# Patient Record
Sex: Male | Born: 1985
Health system: Southern US, Community
[De-identification: ages and names within clinical notes are randomized; demographics above are authoritative.]

## PROBLEM LIST (undated history)

## (undated) DIAGNOSIS — F209 Schizophrenia, unspecified: Secondary | ICD-10-CM

## (undated) DIAGNOSIS — K219 Gastro-esophageal reflux disease without esophagitis: Secondary | ICD-10-CM

## (undated) DIAGNOSIS — Z87898 Personal history of other specified conditions: Secondary | ICD-10-CM

## (undated) DIAGNOSIS — F319 Bipolar disorder, unspecified: Secondary | ICD-10-CM

## (undated) DIAGNOSIS — Z8711 Personal history of peptic ulcer disease: Secondary | ICD-10-CM

## (undated) DIAGNOSIS — N50812 Left testicular pain: Secondary | ICD-10-CM

## (undated) DIAGNOSIS — R3911 Hesitancy of micturition: Secondary | ICD-10-CM

## (undated) DIAGNOSIS — Z8719 Personal history of other diseases of the digestive system: Secondary | ICD-10-CM

## (undated) DIAGNOSIS — Z972 Presence of dental prosthetic device (complete) (partial): Secondary | ICD-10-CM

## (undated) DIAGNOSIS — G43909 Migraine, unspecified, not intractable, without status migrainosus: Secondary | ICD-10-CM

## (undated) DIAGNOSIS — R3915 Urgency of urination: Secondary | ICD-10-CM

## (undated) HISTORY — DX: Gastro-esophageal reflux disease without esophagitis: K21.9

## (undated) HISTORY — PX: APPENDECTOMY: SHX54

---

## 2003-03-14 ENCOUNTER — Inpatient Hospital Stay (HOSPITAL_COMMUNITY): Admission: EM | Admit: 2003-03-14 | Discharge: 2003-03-19 | Payer: Self-pay | Admitting: Psychiatry

## 2003-03-15 ENCOUNTER — Encounter (HOSPITAL_COMMUNITY): Payer: Self-pay | Admitting: Psychiatry

## 2003-04-25 ENCOUNTER — Inpatient Hospital Stay (HOSPITAL_COMMUNITY): Admission: EM | Admit: 2003-04-25 | Discharge: 2003-04-29 | Payer: Self-pay | Admitting: Psychiatry

## 2004-08-12 ENCOUNTER — Inpatient Hospital Stay (HOSPITAL_COMMUNITY): Admission: RE | Admit: 2004-08-12 | Discharge: 2004-08-21 | Payer: Self-pay | Admitting: Psychiatry

## 2004-08-12 ENCOUNTER — Ambulatory Visit: Payer: Self-pay | Admitting: Psychiatry

## 2004-08-14 ENCOUNTER — Encounter: Payer: Self-pay | Admitting: Psychiatry

## 2004-12-24 ENCOUNTER — Inpatient Hospital Stay (HOSPITAL_COMMUNITY): Admission: RE | Admit: 2004-12-24 | Discharge: 2004-12-28 | Payer: Self-pay | Admitting: Psychiatry

## 2004-12-24 ENCOUNTER — Ambulatory Visit: Payer: Self-pay | Admitting: Psychiatry

## 2004-12-25 ENCOUNTER — Encounter: Payer: Self-pay | Admitting: Internal Medicine

## 2005-06-03 ENCOUNTER — Ambulatory Visit: Payer: Self-pay | Admitting: Family Medicine

## 2012-02-22 DIAGNOSIS — R599 Enlarged lymph nodes, unspecified: Secondary | ICD-10-CM | POA: Diagnosis not present

## 2012-02-28 ENCOUNTER — Encounter (HOSPITAL_COMMUNITY): Payer: Self-pay | Admitting: *Deleted

## 2012-02-28 ENCOUNTER — Emergency Department (HOSPITAL_COMMUNITY)
Admission: EM | Admit: 2012-02-28 | Discharge: 2012-02-29 | Disposition: A | Discharge status: Home / Self Care | Payer: Medicare Other | Attending: Emergency Medicine | Admitting: Emergency Medicine

## 2012-02-28 ENCOUNTER — Emergency Department (HOSPITAL_COMMUNITY): Payer: Medicare Other

## 2012-02-28 DIAGNOSIS — F17200 Nicotine dependence, unspecified, uncomplicated: Secondary | ICD-10-CM | POA: Insufficient documentation

## 2012-02-28 DIAGNOSIS — I889 Nonspecific lymphadenitis, unspecified: Secondary | ICD-10-CM

## 2012-02-28 DIAGNOSIS — Z8659 Personal history of other mental and behavioral disorders: Secondary | ICD-10-CM | POA: Insufficient documentation

## 2012-02-28 DIAGNOSIS — F319 Bipolar disorder, unspecified: Secondary | ICD-10-CM | POA: Insufficient documentation

## 2012-02-28 DIAGNOSIS — R599 Enlarged lymph nodes, unspecified: Secondary | ICD-10-CM | POA: Diagnosis not present

## 2012-02-28 DIAGNOSIS — R1909 Other intra-abdominal and pelvic swelling, mass and lump: Secondary | ICD-10-CM | POA: Diagnosis not present

## 2012-02-28 HISTORY — DX: Bipolar disorder, unspecified: F31.9

## 2012-02-28 HISTORY — DX: Schizophrenia, unspecified: F20.9

## 2012-02-28 LAB — URINALYSIS, ROUTINE W REFLEX MICROSCOPIC
Glucose, UA: NEGATIVE mg/dL
Hgb urine dipstick: NEGATIVE
Leukocytes, UA: NEGATIVE
Specific Gravity, Urine: <1.005 — ABNORMAL LOW (ref 1.005–1.030)
Urobilinogen, UA: 0.2 mg/dL (ref 0.0–1.0)

## 2012-02-28 NOTE — ED Provider Notes (Signed)
History   This chart was scribed for [employer]. Colon Branch, MD by Sofie Rower. The patient was seen in room APA08/APA08 and the patient's care was started at 11:13PM.    CSN: [id number]  Arrival date & time 02/28/12  1925   None     Chief Complaint  Patient presents with  . Abdominal Pain    (Consider location/radiation/quality/duration/timing/severity/associated sxs/prior treatment) HPI  Steven Quinn is a 26 y.o. male who presents to the Emergency Department complaining of moderate, constant abdominal pain located in the left lower abdomen onset three weeks ago with associated symptoms of swelling, knots in groin area, vomiting, radiating left leg pain. Pt states "he went to the Dr. Manson Passey and was given an antibiotic (Cipro) and penicillin injection, and told it may be possible swollen lymph node. Modifying factors include sitting and exertion which intensifies the pain. Pt has a hx of appendectomy.  Pt denies fever, chills, weight loss.   PCP [facility] internal Medicine.   Past Medical History  Diagnosis Date  . Bipolar 1 disorder   . Schizophrenia     Past Surgical History  Procedure Date  . Appendectomy     No family history on file.  History  Substance Use Topics  . Smoking status: Current Everyday Smoker -- 1.0 packs/day  . Smokeless tobacco: Not on file  . Alcohol Use: No      Review of Systems  All other systems reviewed and are negative.    10 Systems reviewed and are negative for acute change except as noted in the HPI.   Allergies  Review of patient's allergies indicates no known allergies.  Home Medications   Current Outpatient Rx  Name Route Sig Dispense Refill  . CIPROFLOXACIN HCL 500 MG PO TABS Oral Take 500 mg by mouth 2 (two) times daily.      BP 128/71  Pulse 73  Temp(Src) 98.3 F (36.8 C) (Oral)  Resp 20  Ht 6\' 1"  (1.854 m)  Wt 184 lb (83.462 kg)  BMI 24.28 kg/m2  SpO2 100%  Physical Exam  Nursing note and vitals  reviewed. Constitutional: He is oriented to person, place, and time. He appears well-developed and well-nourished.  HENT:  Head: Normocephalic and atraumatic.  Right Ear: External ear normal.  Left Ear: External ear normal.  Nose: Nose normal.  Eyes: Conjunctivae and EOM are normal. No scleral icterus.  Neck: Normal range of motion. Neck supple. No thyromegaly present.  Cardiovascular: Normal rate and regular rhythm.  Exam reveals no gallop and no friction rub.   No murmur heard. Pulmonary/Chest: No stridor. He has no wheezes. He has no rales. He exhibits no tenderness.  Abdominal: He exhibits no distension. There is no tenderness. There is no rebound.  Musculoskeletal: Normal range of motion. He exhibits no edema.       1 cm fully soft tender node mid-inguinal area, one small slightly shotty node slightly above it. No other inguinal or axillary nodes detected.   Lymphadenopathy:    He has no cervical adenopathy.  Neurological: He is oriented to person, place, and time. Coordination normal.  Skin: Skin is warm and dry. No rash noted. No erythema.  Psychiatric: He has a normal mood and affect. His behavior is normal.    ED Course  Procedures (including critical care time)  DIAGNOSTIC STUDIES: Oxygen Saturation is 100% on room air, normal by my interpretation.    COORDINATION OF CARE:  Ct Abdomen Pelvis W Contrast  02/29/2012  *RADIOLOGY REPORT*  Clinical Data: Swollen inguinal lymph nodes  CT ABDOMEN AND PELVIS WITH CONTRAST  Technique:  Multidetector CT imaging of the abdomen and pelvis was performed following the standard protocol during bolus administration of intravenous contrast.  Contrast: OMNIPAQUE IOHEXOL 300 MG/ML IJ SOLN  Comparison: None.  Findings: Visualized lung bases are clear.  Liver is normal.  Spleen is normal.  Pancreas is normal.  The adrenal glands are normal.   The kidneys are normal.  Gallbladder is unremarkable by CT.  No biliary ductal dilation.  Stomach  and visualized large and small bowel are unremarkable.  Abdominal aorta is normal in caliber.  No significant lymphadenopathy.  No free fluid or abnormal fluid collections.  Prior appendectomy.  Visualized colon and small bowel are unremarkable.  No free fluid or abnormal fluid collections.  Borderline enlarged left inguinal lymph nodes are identified.  The largest lymph node measures 1.8 cm, image 86.  Urinary bladder is normal.  IMPRESSION:  1.  Enlarged left inguinal lymph nodes measure up to 1.8 cm and are of indeterminate etiology.  Likely reactive.  Differential considerations include lymphoma and metastatic adenopathy.  Original Report Authenticated By: Rosealee Albee, M.D.   Results for orders placed during the hospital encounter of 02/28/12  URINALYSIS, ROUTINE W REFLEX MICROSCOPIC      Component Value Range   Color, Urine YELLOW  YELLOW    APPearance CLEAR  CLEAR    Specific Gravity, Urine <1.005 (*) 1.005 - 1.030    pH 6.0  5.0 - 8.0    Glucose, UA NEGATIVE  NEGATIVE (mg/dL)   Hgb urine dipstick NEGATIVE  NEGATIVE    Bilirubin Urine NEGATIVE  NEGATIVE    Ketones, ur NEGATIVE  NEGATIVE (mg/dL)   Protein, ur NEGATIVE  NEGATIVE (mg/dL)   Urobilinogen, UA 0.2  0.0 - 1.0 (mg/dL)   Nitrite NEGATIVE  NEGATIVE    Leukocytes, UA NEGATIVE  NEGATIVE      11:18PM- EDP at bedside discusses treatment plan concerning lymphadenopathy and cat scan of the abdomen and pelvis.   MDM  Patient with left inguinal swollen lymph node x 3 weeks currently on antibiotics with no improvement. UA negative. CT with several large left sided lymph nodes. Given antiinflammatory and analgesic with some relief. Dx testing d/w pt and family.  Questions answered.  Verb understanding, agreeable to d/c home with outpt f/u.Pt stable in ED with no significant deterioration in condition.The patient appears reasonably screened and/or stabilized for discharge and I doubt any other medical condition or other [facility] requiring  further screening, evaluation, or treatment in the ED at this time prior to discharge. I personally performed the services described in this documentation, which was scribed in my presence. The recorded information has been reviewed and considered.  MDM Reviewed: nursing note and vitals Interpretation: labs and CT scan            Nicoletta Dress. Colon Branch, MD 02/29/12 [phone]

## 2012-02-28 NOTE — ED Notes (Signed)
Pain in left lower abdomen

## 2012-02-29 MED ORDER — IBUPROFEN 800 MG PO TABS: 800.0000 mg | ORAL_TABLET | Freq: Three times a day (TID) | ORAL | Status: AC

## 2012-02-29 MED ORDER — IOHEXOL 300 MG/ML  SOLN
100.0000 mL | Freq: Once | INTRAMUSCULAR | Status: AC | PRN
  Administered 2012-02-29: 100 mL via INTRAVENOUS

## 2012-02-29 MED ORDER — HYDROCODONE-ACETAMINOPHEN 5-325 MG PO TABS: 1.0000 | ORAL_TABLET | ORAL | Status: AC | PRN

## 2012-02-29 NOTE — Discharge Instructions (Signed)
You may apply heat or ice to the area for comfort. Use the ibuprofen as directed. Use the stronger pain medicine as needed. If it continues to be swollen after an additional 8-12 weeks, have it re-evaluated.     Swollen Lymph Nodes The lymphatic system filters fluid from around cells. It is like a system of blood vessels. These channels carry lymph instead of blood. The lymphatic system is an important part of the immune (disease fighting) system. When people talk about "swollen glands in the neck," they are usually talking about swollen lymph nodes. The lymph nodes are like the little traps for infection. You and your caregiver may be able to feel lymph nodes, especially swollen nodes, in these common areas: the groin (inguinal area), armpits (axilla), and above the clavicle (supraclavicular). You may also feel them in the neck (cervical) and the back of the head just above the hairline (occipital). Swollen glands occur when there is any condition in which the body responds with an allergic type of reaction. For instance, the glands in the neck can become swollen from insect bites or any type of minor infection on the head. These are very noticeable in children with only minor problems. Lymph nodes may also become swollen when there is a tumor or problem with the lymphatic system, such as Hodgkin's disease. TREATMENT   Most swollen glands do not require treatment. They can be observed (watched) for a short period of time, if your caregiver feels it is necessary. Most of the time, observation is not necessary.   Antibiotics (medicines that kill germs) may be prescribed by your caregiver. Your caregiver may prescribe these if he or she feels the swollen glands are due to a bacterial (germ) infection. Antibiotics are not used if the swollen glands are caused by a virus.  HOME CARE INSTRUCTIONS   Take medications as directed by your caregiver. Only take over-the-counter or prescription medicines for  pain, discomfort, or fever as directed by your caregiver.  SEEK MEDICAL CARE IF:   If you begin to run a temperature greater than 102 F (38.9 C), or as your caregiver suggests.  MAKE SURE YOU:   Understand these instructions.   Will watch your condition.   Will get help right away if you are not doing well or get worse.  Document Released: 11/19/2002 Document Revised: 11/18/2011 Document Reviewed: 11/29/2005 [facility] Patient Information 2012 [place], [place].

## 2012-03-02 DIAGNOSIS — F259 Schizoaffective disorder, unspecified: Secondary | ICD-10-CM | POA: Diagnosis not present

## 2012-04-26 DIAGNOSIS — F259 Schizoaffective disorder, unspecified: Secondary | ICD-10-CM | POA: Diagnosis not present

## 2012-06-09 DIAGNOSIS — T1500XA Foreign body in cornea, unspecified eye, initial encounter: Secondary | ICD-10-CM | POA: Diagnosis not present

## 2012-06-09 DIAGNOSIS — F17200 Nicotine dependence, unspecified, uncomplicated: Secondary | ICD-10-CM | POA: Diagnosis not present

## 2012-06-09 DIAGNOSIS — S058X9A Other injuries of unspecified eye and orbit, initial encounter: Secondary | ICD-10-CM | POA: Diagnosis not present

## 2012-06-09 DIAGNOSIS — Z79899 Other long term (current) drug therapy: Secondary | ICD-10-CM | POA: Diagnosis not present

## 2012-06-13 DIAGNOSIS — T1500XA Foreign body in cornea, unspecified eye, initial encounter: Secondary | ICD-10-CM | POA: Diagnosis not present

## 2012-06-13 DIAGNOSIS — H20039 Secondary infectious iridocyclitis, unspecified eye: Secondary | ICD-10-CM | POA: Diagnosis not present

## 2012-08-07 DIAGNOSIS — L02419 Cutaneous abscess of limb, unspecified: Secondary | ICD-10-CM | POA: Diagnosis not present

## 2012-08-07 DIAGNOSIS — F17200 Nicotine dependence, unspecified, uncomplicated: Secondary | ICD-10-CM | POA: Diagnosis not present

## 2012-08-07 DIAGNOSIS — M7989 Other specified soft tissue disorders: Secondary | ICD-10-CM | POA: Diagnosis not present

## 2012-08-07 DIAGNOSIS — L03119 Cellulitis of unspecified part of limb: Secondary | ICD-10-CM | POA: Diagnosis not present

## 2013-03-09 DIAGNOSIS — M545 Low back pain: Secondary | ICD-10-CM | POA: Diagnosis not present

## 2013-03-14 DIAGNOSIS — F259 Schizoaffective disorder, unspecified: Secondary | ICD-10-CM | POA: Diagnosis not present

## 2013-04-27 DIAGNOSIS — Z0000 Encounter for general adult medical examination without abnormal findings: Secondary | ICD-10-CM | POA: Diagnosis not present

## 2013-04-30 DIAGNOSIS — Z79899 Other long term (current) drug therapy: Secondary | ICD-10-CM | POA: Diagnosis not present

## 2013-09-08 DIAGNOSIS — F209 Schizophrenia, unspecified: Secondary | ICD-10-CM | POA: Diagnosis not present

## 2013-09-08 DIAGNOSIS — Z79899 Other long term (current) drug therapy: Secondary | ICD-10-CM | POA: Diagnosis not present

## 2013-09-08 DIAGNOSIS — R45851 Suicidal ideations: Secondary | ICD-10-CM | POA: Diagnosis not present

## 2013-09-08 DIAGNOSIS — F319 Bipolar disorder, unspecified: Secondary | ICD-10-CM | POA: Diagnosis not present

## 2013-09-08 DIAGNOSIS — F17200 Nicotine dependence, unspecified, uncomplicated: Secondary | ICD-10-CM | POA: Diagnosis not present

## 2013-09-08 DIAGNOSIS — F489 Nonpsychotic mental disorder, unspecified: Secondary | ICD-10-CM | POA: Diagnosis not present

## 2013-09-12 DIAGNOSIS — F3160 Bipolar disorder, current episode mixed, unspecified: Secondary | ICD-10-CM | POA: Diagnosis not present

## 2013-09-18 DIAGNOSIS — F259 Schizoaffective disorder, unspecified: Secondary | ICD-10-CM | POA: Diagnosis not present

## 2014-01-16 ENCOUNTER — Ambulatory Visit (INDEPENDENT_AMBULATORY_CARE_PROVIDER_SITE_OTHER): Payer: Medicare Other | Admitting: Family Medicine

## 2014-01-16 ENCOUNTER — Encounter (INDEPENDENT_AMBULATORY_CARE_PROVIDER_SITE_OTHER): Payer: Self-pay

## 2014-01-16 ENCOUNTER — Encounter: Payer: Self-pay | Admitting: Family Medicine

## 2014-01-16 VITALS — BP 111/70 | HR 67 | Temp 97.9°F | Wt 162.0 lb

## 2014-01-16 DIAGNOSIS — R599 Enlarged lymph nodes, unspecified: Secondary | ICD-10-CM | POA: Diagnosis not present

## 2014-01-16 DIAGNOSIS — R61 Generalized hyperhidrosis: Secondary | ICD-10-CM

## 2014-01-16 DIAGNOSIS — R5383 Other fatigue: Secondary | ICD-10-CM

## 2014-01-16 DIAGNOSIS — M129 Arthropathy, unspecified: Secondary | ICD-10-CM | POA: Diagnosis not present

## 2014-01-16 DIAGNOSIS — R5381 Other malaise: Secondary | ICD-10-CM

## 2014-01-16 DIAGNOSIS — Z7251 High risk heterosexual behavior: Secondary | ICD-10-CM | POA: Diagnosis not present

## 2014-01-16 DIAGNOSIS — R300 Dysuria: Secondary | ICD-10-CM | POA: Diagnosis not present

## 2014-01-16 DIAGNOSIS — M549 Dorsalgia, unspecified: Secondary | ICD-10-CM

## 2014-01-16 DIAGNOSIS — M1990 Unspecified osteoarthritis, unspecified site: Secondary | ICD-10-CM

## 2014-01-16 DIAGNOSIS — R109 Unspecified abdominal pain: Secondary | ICD-10-CM

## 2014-01-16 DIAGNOSIS — R590 Localized enlarged lymph nodes: Secondary | ICD-10-CM

## 2014-01-16 LAB — POCT UA - MICROSCOPIC ONLY
Casts, Ur, LPF, POC: NEGATIVE
Crystals, Ur, HPF, POC: NEGATIVE
Epithelial cells, urine per micros: NEGATIVE
Mucus, UA: NEGATIVE
RBC, urine, microscopic: NEGATIVE
WBC, Ur, HPF, POC: NEGATIVE
Yeast, UA: NEGATIVE

## 2014-01-16 LAB — POCT URINALYSIS DIPSTICK
Bilirubin, UA: NEGATIVE
Blood, UA: NEGATIVE
Glucose, UA: NEGATIVE
Ketones, UA: NEGATIVE
Leukocytes, UA: NEGATIVE
Nitrite, UA: NEGATIVE
Protein, UA: NEGATIVE
Spec Grav, UA: <=1.005
Urobilinogen, UA: NEGATIVE
pH, UA: 7.5

## 2014-01-16 LAB — POCT CBC
Granulocyte percent: 70.1 %G (ref 37–80)
HCT, POC: 47 % (ref 43.5–53.7)
Hemoglobin: 15.2 g/dL (ref 14.1–18.1)
Lymph, poc: 3.4 (ref 0.6–3.4)
MCH, POC: 29.3 pg (ref 27–31.2)
MCHC: 32.4 g/dL (ref 31.8–35.4)
MCV: 90.3 fL (ref 80–97)
MPV: 8.1 fL (ref 0–99.8)
POC Granulocyte: 9 — AB (ref 2–6.9)
POC LYMPH PERCENT: 26.3 %L (ref 10–50)
Platelet Count, POC: 156 10*3/uL (ref 142–424)
RBC: 5.2 M/uL (ref 4.69–6.13)
RDW, POC: 13.4 %
WBC: 12.8 10*3/uL — AB (ref 4.6–10.2)

## 2014-01-16 MED ORDER — CYCLOBENZAPRINE HCL 10 MG PO TABS: 10.0000 mg | ORAL_TABLET | Freq: Three times a day (TID) | ORAL | Status: DC | PRN

## 2014-01-16 MED ORDER — NAPROXEN 500 MG PO TABS: 500.0000 mg | ORAL_TABLET | Freq: Two times a day (BID) | ORAL | Status: DC

## 2014-01-16 NOTE — Progress Notes (Signed)
Subjective:    Patient ID: Steven Quinn, male    DOB: 01/16/1986, 28 y.o.   MRN: [id number]  HPI  This 28 y.o. male presents for evaluation of having multiple pain problems.  He has Back pain, neck pain, and some shoulder pain.  He has intermittant left inguinal pain That is not bothering him at present.  He states he gets some enlarged lymph nodes In his left inguinal LAD that he has been seen by a doctor for in the past and he states He was told he had inflamed lymph nodes and didn't get any tx for this.  Review of Systems    No chest pain, SOB, HA, dizziness, vision change, N/V, diarrhea, constipation, dysuria, urinary urgency or frequency, myalgias, arthralgias or rash.  Objective:   Physical Exam Vital signs noted  Well developed well nourished male.  HEENT - Head atraumatic Normocephalic                Eyes - PERRLA, Conjuctiva - clear Sclera- Clear EOMI                Ears - EAC's Wnl TM's Wnl Gross Hearing WNL                Nose - Nares patent                 Throat - oropharanx wnl Respiratory - Lungs CTA bilateral Cardiac - RRR S1 and S2 without murmur GI - Abdomen soft Nontender and bowel sounds active x 4 Extremities - No edema. Neuro - Grossly intact. GU - testes w/o mass, no inguinal hernia, left inguinal shotty lymph nodes nontender  Results for orders placed in visit on 01/16/14  POCT CBC      Result Value Range   WBC 12.8 (*) 4.6 - 10.2 K/uL   Lymph, poc 3.4  0.6 - 3.4   POC LYMPH PERCENT 26.3  10 - 50 %L   POC Granulocyte 9.0 (*) 2 - 6.9   Granulocyte percent 70.1  37 - 80 %G   RBC 5.2  4.69 - 6.13 M/uL   Hemoglobin 15.2  14.1 - 18.1 g/dL   HCT, POC 47.0  43.5 - 53.7 %   MCV 90.3  80 - 97 fL   MCH, POC 29.3  27 - 31.2 pg   MCHC 32.4  31.8 - 35.4 g/dL   RDW, POC 13.4     Platelet Count, POC 156.0  142 - 424 K/uL   MPV 8.1  0 - 99.8 fL  POCT UA - MICROSCOPIC ONLY      Result Value Range   WBC, Ur, HPF, POC negative     RBC, urine, microscopic  negative     Bacteria, U Microscopic occ     Mucus, UA negative     Epithelial cells, urine per micros negative     Crystals, Ur, HPF, POC negative     Casts, Ur, LPF, POC negative     Yeast, UA negative    POCT URINALYSIS DIPSTICK      Result Value Range   Color, UA gold     Clarity, UA clear     Glucose, UA negative     Bilirubin, UA negative     Ketones, UA negative     Spec Grav, UA <=1.005     Blood, UA negative     pH, UA 7.5     Protein, UA negative     Urobilinogen, UA  negative     Nitrite, UA negative     Leukocytes, UA Negative        Assessment & Plan:  Night sweats - Plan: POCT CBC, CMP14+EGFR, TSH, DG Chest 2 View  Other malaise and fatigue - Plan: DG Chest 2 View  Abdominal pain, unspecified site - Plan: HSV(herpes simplex vrs) 1+2 ab-IgG, HIV antibody, RPR, GC/Chlamydia Probe Amp, DG Abd 1 View  Inguinal lymphadenopathy - Right now not appreciated but states they swell on the left causing discomfort.  He denies any risky sex or cat scratches, recommend at this time tylenol and motrin And warm compresses and labs and will follow up.  Consider [place] if not better.  Leukocytes elevated And UA normal will tx empircally with cipro 540m po bid x 10 days.   Dysuria - Plan: POCT CBC, POCT UA - Microscopic Only, POCT urinalysis dipstick  Back pain - Plan: naproxen (NAPROSYN) 500 MG tablet, cyclobenzaprine (FLEXERIL) 10 MG tablet  Arthritis - Plan: cyclobenzaprine (FLEXERIL) 10 MG tablet, Arthritis Panel  WLysbeth PennerFNP

## 2014-01-17 ENCOUNTER — Ambulatory Visit (INDEPENDENT_AMBULATORY_CARE_PROVIDER_SITE_OTHER): Payer: Medicare Other

## 2014-01-17 DIAGNOSIS — R61 Generalized hyperhidrosis: Secondary | ICD-10-CM

## 2014-01-17 DIAGNOSIS — R5381 Other malaise: Secondary | ICD-10-CM | POA: Diagnosis not present

## 2014-01-17 DIAGNOSIS — R109 Unspecified abdominal pain: Secondary | ICD-10-CM

## 2014-01-17 DIAGNOSIS — R5383 Other fatigue: Secondary | ICD-10-CM

## 2014-01-17 LAB — CMP14+EGFR
ALT: 12 IU/L (ref 0–44)
AST: 16 IU/L (ref 0–40)
Albumin/Globulin Ratio: 1.9 (ref 1.1–2.5)
Albumin: 4.8 g/dL (ref 3.5–5.5)
Alkaline Phosphatase: 84 IU/L (ref 39–117)
BUN/Creatinine Ratio: 8 (ref 8–19)
BUN: 6 mg/dL (ref 6–20)
CO2: 25 mmol/L (ref 18–29)
Calcium: 9.8 mg/dL (ref 8.7–10.2)
Chloride: 100 mmol/L (ref 97–108)
Creatinine, Ser: 0.8 mg/dL (ref 0.76–1.27)
GFR calc Af Amer: 140 mL/min/{1.73_m2} (ref >59)
GFR calc non Af Amer: 121 mL/min/{1.73_m2} (ref >59)
Globulin, Total: 2.5 g/dL (ref 1.5–4.5)
Glucose: 103 mg/dL — ABNORMAL HIGH (ref 65–99)
Potassium: 5 mmol/L (ref 3.5–5.2)
Sodium: 142 mmol/L (ref 134–144)
Total Bilirubin: <0.2 mg/dL (ref 0.0–1.2)
Total Protein: 7.3 g/dL (ref 6.0–8.5)

## 2014-01-17 LAB — HSV(HERPES SIMPLEX VRS) I + II AB-IGG
HAV 1 IGG,TYPE SPECIFIC AB: 43.7 index — ABNORMAL HIGH (ref 0.00–0.90)
HSV 2 IGG,TYPE SPECIFIC AB: <0.91 index (ref 0.00–0.90)

## 2014-01-17 LAB — RPR: RPR: NONREACTIVE

## 2014-01-17 LAB — HIV ANTIBODY (ROUTINE TESTING W REFLEX)
HIV 1/O/2 Abs-Index Value: <1 (ref <1.00)
HIV-1/HIV-2 Ab: NONREACTIVE

## 2014-01-17 LAB — ARTHRITIS PANEL
Anti Nuclear Antibody(ANA): NEGATIVE
Rhuematoid fact SerPl-aCnc: 7.3 IU/mL (ref 0.0–13.9)
Sed Rate: 2 mm/hr (ref 0–15)
Uric Acid: 5.7 mg/dL (ref 3.7–8.6)

## 2014-01-17 LAB — TSH: TSH: 3.97 u[IU]/mL (ref 0.450–4.500)

## 2014-01-17 MED ORDER — CIPROFLOXACIN HCL 500 MG PO TABS: 500.0000 mg | ORAL_TABLET | Freq: Two times a day (BID) | ORAL | Status: AC

## 2014-01-19 LAB — GC/CHLAMYDIA PROBE AMP
Chlamydia trachomatis, NAA: NEGATIVE
Neisseria gonorrhoeae by PCR: NEGATIVE

## 2014-01-21 ENCOUNTER — Encounter: Payer: Self-pay | Admitting: Family Medicine

## 2014-01-21 ENCOUNTER — Ambulatory Visit (INDEPENDENT_AMBULATORY_CARE_PROVIDER_SITE_OTHER): Payer: Medicare Other | Admitting: Family Medicine

## 2014-01-21 VITALS — BP 118/69 | HR 62 | Temp 97.3°F | Ht 72.5 in | Wt 163.8 lb

## 2014-01-21 DIAGNOSIS — M1990 Unspecified osteoarthritis, unspecified site: Secondary | ICD-10-CM

## 2014-01-21 DIAGNOSIS — M129 Arthropathy, unspecified: Secondary | ICD-10-CM

## 2014-01-21 NOTE — Progress Notes (Signed)
   Subjective:    Patient ID: Steven Quinn, male    DOB: Feb 25, 1986, 28 y.o.   MRN: [id number]  HPI This 28 y.o. male presents for evaluation of follow up on arthralgias.  His labs show positive HSV 1 and otherwise arthritis panel is negative.  He has been taking naprosyn and it is helping Some for his polyarthralgias..   Review of Systems No chest pain, SOB, HA, dizziness, vision change, N/V, diarrhea, constipation, dysuria, urinary urgency or frequency, myalgias, arthralgias or rash.     Objective:   Physical Exam  Vital signs noted  Well developed well nourished male.  HEENT - Head atraumatic Normocephalic                Eyes - PERRLA, Conjuctiva - clear Sclera- Clear EOMI                Ears - EAC's Wnl TM's Wnl Gross Hearing WNL                Nose - Nares patent                 Throat - oropharanx wnl Respiratory - Lungs CTA bilateral Cardiac - RRR S1 and S2 without murmur GI - Abdomen soft Nontender and bowel sounds active x 4 Extremities - No edema. Neuro - Grossly intact.      Assessment & Plan:  Arthritis Continue naprosyn and flexeril  Deatra CanterWilliam J Crystalynn Mcinerney FNP

## 2014-03-26 DIAGNOSIS — F411 Generalized anxiety disorder: Secondary | ICD-10-CM | POA: Diagnosis not present

## 2014-07-06 DIAGNOSIS — F17200 Nicotine dependence, unspecified, uncomplicated: Secondary | ICD-10-CM | POA: Diagnosis not present

## 2014-07-06 DIAGNOSIS — N411 Chronic prostatitis: Secondary | ICD-10-CM | POA: Diagnosis not present

## 2014-07-06 DIAGNOSIS — Z8659 Personal history of other mental and behavioral disorders: Secondary | ICD-10-CM | POA: Diagnosis not present

## 2014-07-06 DIAGNOSIS — K921 Melena: Secondary | ICD-10-CM | POA: Diagnosis not present

## 2014-07-06 DIAGNOSIS — Z8711 Personal history of peptic ulcer disease: Secondary | ICD-10-CM | POA: Diagnosis not present

## 2014-07-06 DIAGNOSIS — K922 Gastrointestinal hemorrhage, unspecified: Secondary | ICD-10-CM | POA: Diagnosis not present

## 2014-07-06 DIAGNOSIS — K625 Hemorrhage of anus and rectum: Secondary | ICD-10-CM | POA: Diagnosis not present

## 2014-07-06 DIAGNOSIS — I441 Atrioventricular block, second degree: Secondary | ICD-10-CM | POA: Diagnosis not present

## 2014-07-06 DIAGNOSIS — F319 Bipolar disorder, unspecified: Secondary | ICD-10-CM | POA: Diagnosis not present

## 2014-07-06 DIAGNOSIS — Z886 Allergy status to analgesic agent status: Secondary | ICD-10-CM | POA: Diagnosis not present

## 2014-07-07 DIAGNOSIS — K921 Melena: Secondary | ICD-10-CM | POA: Diagnosis not present

## 2015-07-21 IMAGING — CR DG ABDOMEN 1V
1 series · 1 of 1 positions shown · non-contrast
Comparison: CT, [DATE]

CLINICAL DATA: Abdominal pain

EXAM:
ABDOMEN - 1 VIEW

[view not recorded]
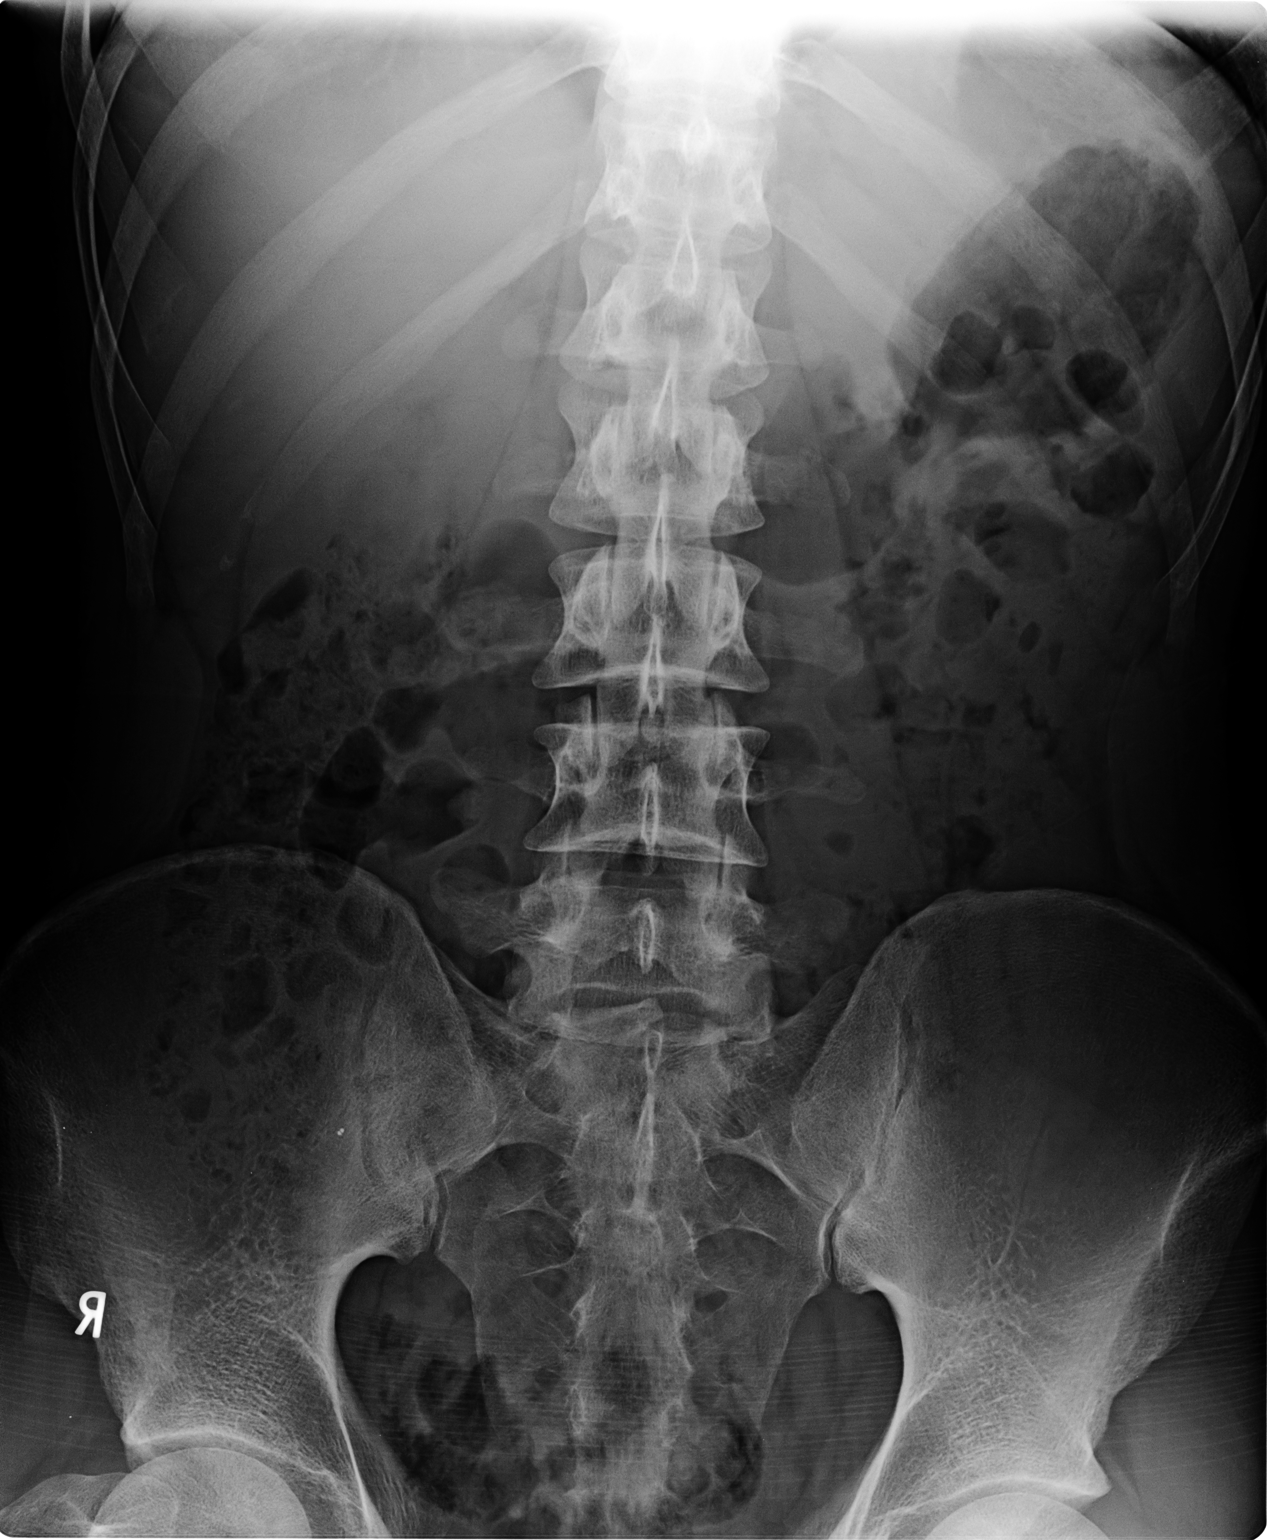

[1 of 1 positions shown; findings below may reference images not displayed]

FINDINGS: The bowel gas pattern is normal. No radio-opaque calculi or other
significant radiographic abnormality are seen.
IMPRESSION: Negative.

## 2015-07-21 IMAGING — CR DG CHEST 2V
2 series · 2 of 2 positions shown · non-contrast
Comparison: None.

CLINICAL DATA: Fatigue and night sweats

EXAM:
CHEST  2 VIEW

[view not recorded (1 of 2)]
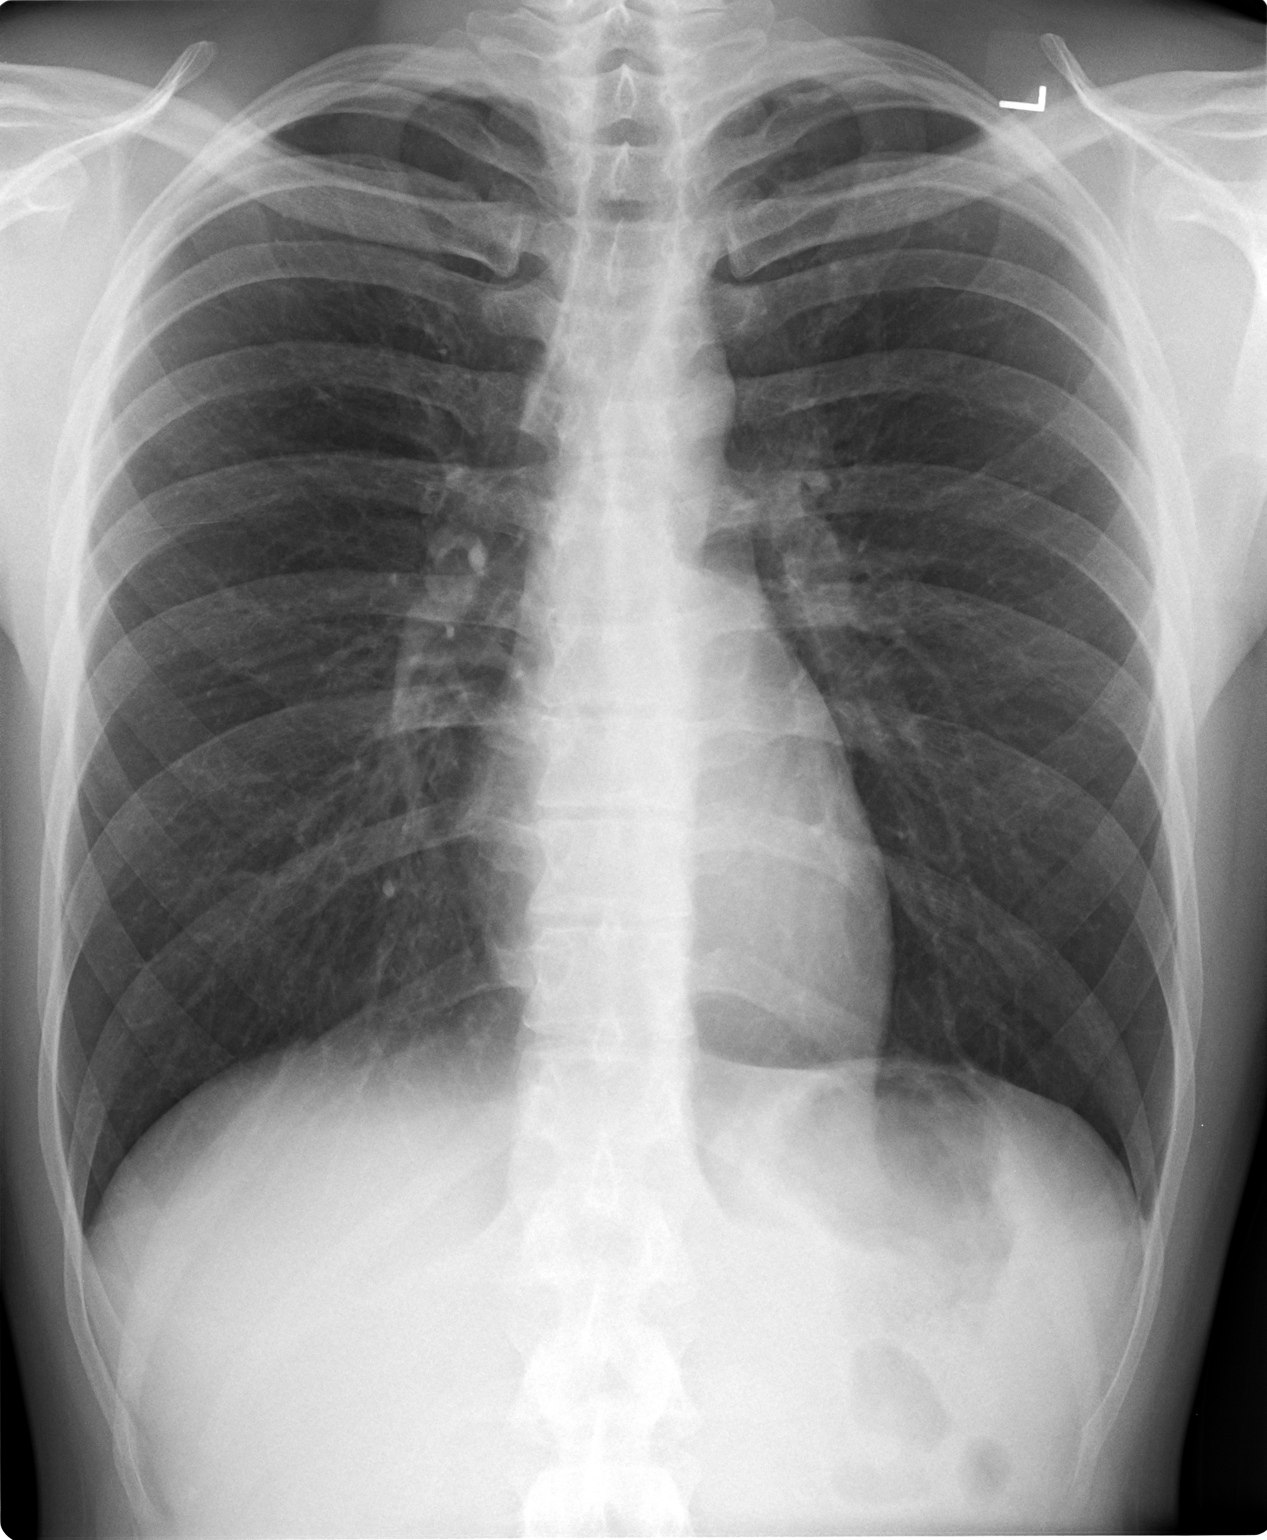

[view not recorded (2 of 2)]
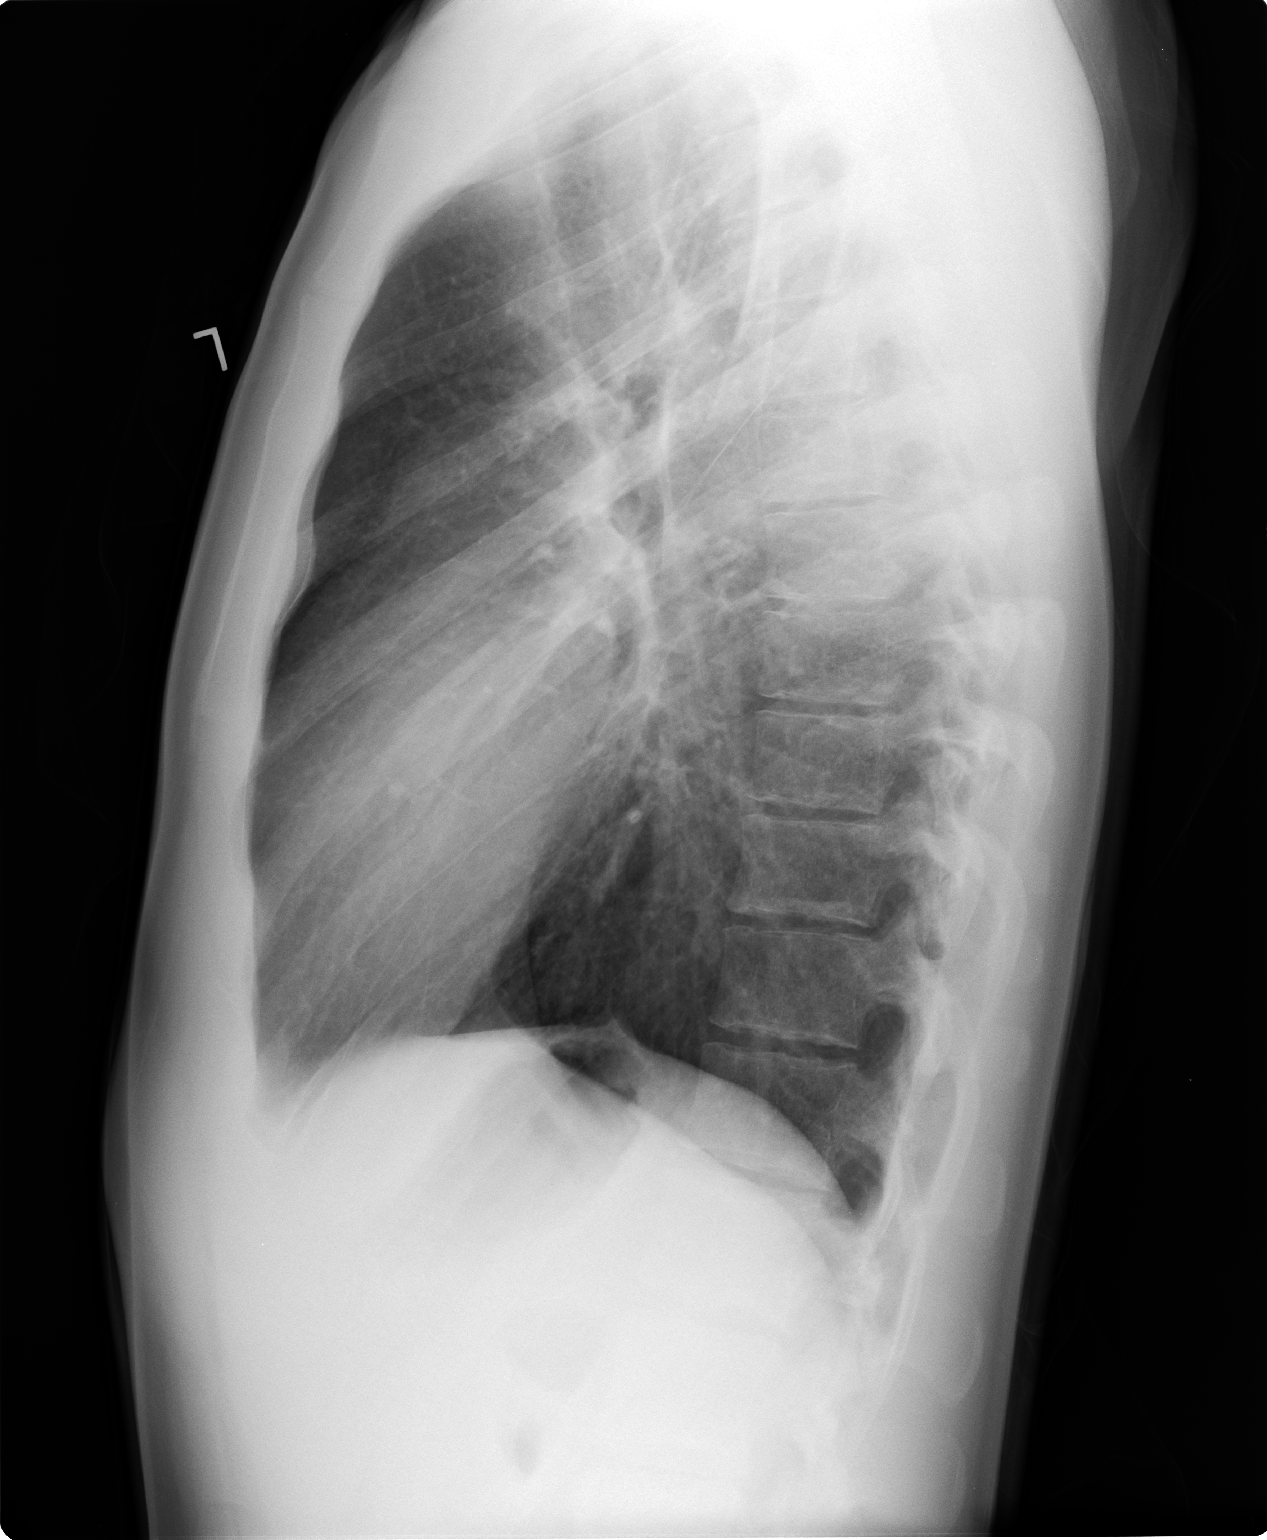

[2 of 2 positions shown; findings below may reference images not displayed]

FINDINGS: Lungs are clear. Heart size and pulmonary vascularity are normal. No
adenopathy. No bone lesions.
IMPRESSION: No abnormality noted.

## 2015-12-19 ENCOUNTER — Ambulatory Visit (INDEPENDENT_AMBULATORY_CARE_PROVIDER_SITE_OTHER): Payer: Medicare Other | Admitting: Family Medicine

## 2015-12-19 ENCOUNTER — Encounter: Payer: Self-pay | Admitting: Family Medicine

## 2015-12-19 VITALS — BP 121/78 | HR 73 | Temp 97.5°F | Ht 72.5 in | Wt 165.4 lb

## 2015-12-19 DIAGNOSIS — F39 Unspecified mood [affective] disorder: Secondary | ICD-10-CM | POA: Diagnosis not present

## 2015-12-19 DIAGNOSIS — M26629 Arthralgia of temporomandibular joint, unspecified side: Secondary | ICD-10-CM | POA: Insufficient documentation

## 2015-12-19 DIAGNOSIS — R599 Enlarged lymph nodes, unspecified: Secondary | ICD-10-CM

## 2015-12-19 DIAGNOSIS — R590 Localized enlarged lymph nodes: Secondary | ICD-10-CM | POA: Insufficient documentation

## 2015-12-19 DIAGNOSIS — M26622 Arthralgia of left temporomandibular joint: Secondary | ICD-10-CM

## 2015-12-19 LAB — POCT URINALYSIS DIPSTICK
BILIRUBIN UA: NEGATIVE
Blood, UA: NEGATIVE
GLUCOSE UA: NEGATIVE
KETONES UA: NEGATIVE
LEUKOCYTES UA: NEGATIVE
NITRITE UA: NEGATIVE
PH UA: 7
Protein, UA: NEGATIVE
Spec Grav, UA: 1.01
Urobilinogen, UA: NEGATIVE

## 2015-12-19 LAB — POCT UA - MICROSCOPIC ONLY
BACTERIA, U MICROSCOPIC: NEGATIVE
Casts, Ur, LPF, POC: NEGATIVE
Crystals, Ur, HPF, POC: NEGATIVE
MUCUS UA: NEGATIVE
RBC, URINE, MICROSCOPIC: NEGATIVE
WBC, UR, HPF, POC: NEGATIVE
Yeast, UA: NEGATIVE

## 2015-12-19 NOTE — Patient Instructions (Signed)
Great to meet you!  I think going to [facility] is a good idea.  Your lymph nodes are caused by your viral infection

## 2015-12-19 NOTE — Progress Notes (Signed)
   HPI  Patient presents today today to discuss inguinal lymphadenopathy, left-sided jaw pain, and mood disorder.  He states that he's had a left-sided inguinal lymph node which is very tender for the last year or so. He's previously for GC C, HIV, and other usual sexually transmitted infections which were negative. 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 Denies any rash or penile discharge. He states that it intermittently tender, at times he has quite a bit of pain associated with it.  Left-sided jaw pain for several weeks, he has an appointment with his dentist next week to discuss it. Described as sharp pain with opening his mouth and chewing just anterior to his left ear.  He's been diagnosed with bipolar disorder and schizophrenia, he states that the best treatment he's gotten was Klonopin. [facility]. He states [place] He denies suicidal ideation  PMH: Smoking status noted ROS: Per HPI  Objective: BP 121/78 mmHg  Pulse 73  Temp(Src) 97.5 F (36.4 C) (Oral)  Ht 6' 0.5" (1.842 m)  Wt 165 lb 6.4 oz (75.025 kg)  BMI 22.11 kg/m2 Gen: NAD, alert, cooperative with exam HEENT: NCAT, tenderness to palpation of his left TMJ with opening his mouth CV: RRR, good S1/S2, no murmur Resp: CTABL, no wheezes, non-labored  GU: No inguinal hernia, he does have bilateral enlarged lymph nodes, his right side is actually little bit more tender than his left, they are firm and mobile Ext: No edema, warm Neuro: Alert and oriented, No gross deficits  Assessment and plan:  # Inguinal lymphadenopathy Evaluate for sexually transmitted infections HSV-1 is unlikely to be the cause as he does not have genital outbreaks but rather herpes labialis If all is negative consider sending to surgery for biopsy for persistent  lymphadenopathy  # Temporomandibular joint pain. Follow-up with dentist next week Conservative care until then including NSAIDs, Tylenol  # Mood disorder No suicidal ideation He's requesting benzodiazepines I recommended that he see a psychiatrist, [facility] [place]   Orders Placed This Encounter  Procedures  . GC/Chlamydia Probe Amp  . RPR  . HIV antibody  . POCT UA - Microscopic Only  . POCT urinalysis dipstick     Murtis SinkSam Bradshaw, MD [facility] 12/19/2015, 11:32 AM

## 2015-12-20 LAB — RPR: RPR Ser Ql: NONREACTIVE

## 2015-12-20 LAB — HIV ANTIBODY (ROUTINE TESTING W REFLEX): HIV Screen 4th Generation wRfx: NONREACTIVE

## 2015-12-22 ENCOUNTER — Other Ambulatory Visit: Payer: Self-pay | Admitting: Family Medicine

## 2015-12-22 DIAGNOSIS — R590 Localized enlarged lymph nodes: Secondary | ICD-10-CM

## 2015-12-22 LAB — GC/CHLAMYDIA PROBE AMP
Chlamydia trachomatis, NAA: NEGATIVE
NEISSERIA GONORRHOEAE BY PCR: NEGATIVE

## 2016-01-23 ENCOUNTER — Ambulatory Visit: Payer: Self-pay | Admitting: Surgery

## 2016-01-23 DIAGNOSIS — R590 Localized enlarged lymph nodes: Secondary | ICD-10-CM | POA: Diagnosis not present

## 2016-01-23 NOTE — H&P (Signed)
History of Present Illness Steven Quinn. Steven Marrin MD; 01/23/2016 11:19 AM) The patient is a 30 year old male who presents with lymphadenopathy. Referred by Dr. Murtis Quinn for lymphadenopathy  This is a 30 year old male who presents with several years of palpable lymph nodes in both groins. However over the last year the left groin has become more tender. The pain radiates towards his testicle. According to Dr. Felipa Quinn note, he has tested negative for HIV and other STDs. However he is positive for HSV 1. He also has described some left-sided jaw pain. He is able to palpate lymph nodes in both groins. He also is able to palpate lymph nodes on the posterior of both sides of his neck. With further questioning, the patient reports increasing levels of fatigue with minimal activity. He and his wife also report frequent night sweats where he wakes up soaked in sweat requiring changing of the bed sheets. This has been going on for over a year. He has had minimal weight loss. He continues to smoke 1.5-2 packs per day. No recent imaging.   Other Problems Steven Quinn; 01/23/2016 10:37 AM) Anxiety Disorder Arthritis Back Pain Chest pain Depression Gastric Ulcer Gastroesophageal Reflux Disease Migraine Headache  Past Surgical History Steven Quinn; 01/23/2016 10:37 AM) Appendectomy Oral Surgery  Diagnostic Studies History Steven Quinn; 01/23/2016 10:37 AM) Colonoscopy never  Allergies Steven Quinn; 01/23/2016 10:37 AM) Darvon-N *ANALGESICS - OPIOID*  Medication History Steven Quinn; 01/23/2016 10:37 AM) No Current Medications Medications Reconciled  Social History Steven Quinn; 01/23/2016 10:37 AM) Alcohol use Occasional alcohol use. Caffeine use Carbonated beverages, Coffee, Tea. Illicit drug use Prefer to discuss with provider. Tobacco use Current every day smoker.  Family History Steven Quinn; 01/23/2016 10:37 AM) Alcohol Abuse  Family Members In General. Depression Family Members In General. Diabetes Mellitus Family Members In General, Father, Mother. Hypertension Family Members In General, Father, Mother. Migraine Headache Father.     Review of Systems Steven Quinn Quinn; 01/23/2016 10:37 AM) General Present- Appetite Loss, Fatigue and Night Sweats. Not Present- Chills, Fever, Weight Gain and Weight Loss. Skin Present- Non-Healing Wounds. Not Present- Change in Wart/Mole, Dryness, Hives, Jaundice, New Lesions, Rash and Ulcer. HEENT Present- Visual Disturbances. Not Present- Earache, Hearing Loss, Hoarseness, Nose Bleed, Oral Ulcers, Ringing in the Ears, Seasonal Allergies, Sinus Pain, Sore Throat, Wears glasses/contact lenses and Yellow Eyes. Respiratory Present- Difficulty Breathing and Wheezing. Not Present- Bloody sputum, Chronic Cough and Snoring. Cardiovascular Present- Chest Pain, Difficulty Breathing Lying Down, Leg Cramps and Shortness of Breath. Not Present- Palpitations, Rapid Heart Rate and Swelling of Extremities. Gastrointestinal Present- Abdominal Pain, Bloating, Chronic diarrhea, Gets full quickly at meals and Nausea. Not Present- Bloody Stool, Change in Bowel Habits, Constipation, Difficulty Swallowing, Excessive gas, Hemorrhoids, Indigestion, Rectal Pain and Vomiting. Male Genitourinary Present- Impotence, Nocturia and Painful Urination. Not Present- Blood in Urine, Change in Urinary Stream, Frequency, Urgency and Urine Leakage. Musculoskeletal Present- Back Pain, Joint Pain, Joint Stiffness, Muscle Pain and Muscle Weakness. Not Present- Swelling of Extremities. Neurological Present- Decreased Memory, Fainting and Headaches. Not Present- Numbness, Seizures, Tingling, Tremor, Trouble walking and Weakness. Psychiatric Present- Anxiety, Bipolar, Change in Sleep Pattern and Depression. Not Present- Fearful and Frequent crying. Endocrine Present- Hair Changes. Not Present- Cold Intolerance, Excessive  Hunger, Heat Intolerance, Hot flashes and New Diabetes. Hematology Present- Gland problems. Not Present- Easy Bruising, Excessive bleeding, HIV and Persistent Infections.  Vitals Steven Quinn Quinn; 01/23/2016 10:38 AM) 01/23/2016 10:37 AM Weight: 169 lb Height:  73in Body Surface Area: 2 m Body Mass Index: 22.3 kg/m  Temp.: 97.56F  Pulse: 87 (Regular)  BP: 130/70 (Sitting, Left Arm, Standard)      Physical Exam Steven Hazard K. Tyr Franca MD; 01/23/2016 11:20 AM)  The physical exam findings are as follows: Note:WDWN in NAD HEENT: EOMI, sclera anicteric Neck: Tender over left TMJ; no swelling; bilateral posterior cervical lymphadenopathy Lungs: CTA bilaterally; normal respiratory effort; No palpable axillary lymphadenopathy CV: Regular rate and rhythm; no murmurs Abd: +bowel sounds, soft, non-tender, no masses GU: bilateral inguinal lymphadenopathy; largest is about 2 cm; tender on left No sign of inguinal hernia Ext: Well-perfused; no edema Skin: Warm, dry; no sign of jaundice    Assessment & Plan Steven Hazard K. Serra Younan MD; 01/23/2016 11:20 AM)  LYMPHADENOPATHY, INGUINAL (R59.0) Impression: Generalized lymphadenopathy with worsening fatigue and night sweats concerning for lymphoma.  Current Plans Schedule for Surgery - left inguinal lymph node biopsy. The surgical procedure has been discussed with the patient. Potential risks, benefits, alternative treatments, and expected outcomes have been explained. All of the patient's questions at this time have been answered. The likelihood of reaching the patient's treatment goal is good. The patient understand the proposed surgical procedure and wishes to proceed. Started Percocet 5-325MG , 1 (one) Tablet every four hours, as needed, #40, 01/23/2016, No Refill.   Steven Quinn. Corliss Skains, MD, [facility]  General/ Trauma Surgery  01/23/2016 11:22 AM

## 2016-01-29 ENCOUNTER — Encounter (HOSPITAL_BASED_OUTPATIENT_CLINIC_OR_DEPARTMENT_OTHER): Payer: Self-pay | Admitting: *Deleted

## 2016-01-31 DIAGNOSIS — L253 Unspecified contact dermatitis due to other chemical products: Secondary | ICD-10-CM | POA: Diagnosis not present

## 2016-01-31 DIAGNOSIS — F17200 Nicotine dependence, unspecified, uncomplicated: Secondary | ICD-10-CM | POA: Diagnosis not present

## 2016-01-31 DIAGNOSIS — T6591XA Toxic effect of unspecified substance, accidental (unintentional), initial encounter: Secondary | ICD-10-CM | POA: Diagnosis not present

## 2016-01-31 DIAGNOSIS — L259 Unspecified contact dermatitis, unspecified cause: Secondary | ICD-10-CM | POA: Diagnosis not present

## 2016-02-02 ENCOUNTER — Other Ambulatory Visit: Payer: Self-pay | Admitting: General Surgery

## 2016-02-04 ENCOUNTER — Ambulatory Visit (HOSPITAL_BASED_OUTPATIENT_CLINIC_OR_DEPARTMENT_OTHER): Payer: Medicare Other | Admitting: Anesthesiology

## 2016-02-04 ENCOUNTER — Encounter (HOSPITAL_BASED_OUTPATIENT_CLINIC_OR_DEPARTMENT_OTHER)
Admission: RE | Disposition: A | Discharge status: Home / Self Care | Payer: Self-pay | Source: Ambulatory Visit | Attending: Surgery

## 2016-02-04 ENCOUNTER — Encounter (HOSPITAL_BASED_OUTPATIENT_CLINIC_OR_DEPARTMENT_OTHER): Payer: Self-pay | Admitting: Anesthesiology

## 2016-02-04 ENCOUNTER — Ambulatory Visit (HOSPITAL_BASED_OUTPATIENT_CLINIC_OR_DEPARTMENT_OTHER)
Admission: RE | Admit: 2016-02-04 | Discharge: 2016-02-04 | Disposition: A | Discharge status: Home / Self Care | Payer: Medicare Other | Source: Ambulatory Visit | Attending: Surgery | Admitting: Surgery

## 2016-02-04 DIAGNOSIS — R599 Enlarged lymph nodes, unspecified: Secondary | ICD-10-CM | POA: Diagnosis not present

## 2016-02-04 DIAGNOSIS — K219 Gastro-esophageal reflux disease without esophagitis: Secondary | ICD-10-CM | POA: Insufficient documentation

## 2016-02-04 DIAGNOSIS — Z7952 Long term (current) use of systemic steroids: Secondary | ICD-10-CM | POA: Insufficient documentation

## 2016-02-04 DIAGNOSIS — F17210 Nicotine dependence, cigarettes, uncomplicated: Secondary | ICD-10-CM | POA: Diagnosis not present

## 2016-02-04 DIAGNOSIS — F209 Schizophrenia, unspecified: Secondary | ICD-10-CM | POA: Diagnosis not present

## 2016-02-04 DIAGNOSIS — Z79899 Other long term (current) drug therapy: Secondary | ICD-10-CM | POA: Insufficient documentation

## 2016-02-04 DIAGNOSIS — R591 Generalized enlarged lymph nodes: Secondary | ICD-10-CM | POA: Insufficient documentation

## 2016-02-04 DIAGNOSIS — F319 Bipolar disorder, unspecified: Secondary | ICD-10-CM | POA: Insufficient documentation

## 2016-02-04 DIAGNOSIS — R590 Localized enlarged lymph nodes: Secondary | ICD-10-CM | POA: Diagnosis not present

## 2016-02-04 HISTORY — DX: Personal history of other diseases of the digestive system: Z87.19

## 2016-02-04 HISTORY — PX: INGUINAL LYMPH NODE BIOPSY: SHX5865

## 2016-02-04 HISTORY — DX: Personal history of peptic ulcer disease: Z87.11

## 2016-02-04 HISTORY — DX: Bipolar disorder, unspecified: F31.9

## 2016-02-04 HISTORY — DX: Urgency of urination: R39.15

## 2016-02-04 HISTORY — DX: Presence of dental prosthetic device (complete) (partial): Z97.2

## 2016-02-04 HISTORY — DX: Migraine, unspecified, not intractable, without status migrainosus: G43.909

## 2016-02-04 SURGERY — BIOPSY, LYMPH NODE, INGUINAL, OPEN
Anesthesia: General | Site: Groin | Laterality: Left

## 2016-02-04 MED ORDER — MORPHINE SULFATE (PF) 2 MG/ML IV SOLN: 2.0000 mg | INTRAVENOUS | Status: DC | PRN

## 2016-02-04 MED ORDER — FENTANYL CITRATE (PF) 100 MCG/2ML IJ SOLN
INTRAMUSCULAR | Status: AC
  Filled 2016-02-04: qty 2

## 2016-02-04 MED ORDER — OXYCODONE-ACETAMINOPHEN 5-325 MG PO TABS: 1.0000 | ORAL_TABLET | ORAL | Status: DC | PRN

## 2016-02-04 MED ORDER — OXYCODONE HCL 5 MG/5ML PO SOLN: 5.0000 mg | Freq: Once | ORAL | Status: AC | PRN

## 2016-02-04 MED ORDER — FENTANYL CITRATE (PF) 100 MCG/2ML IJ SOLN
50.0000 ug | INTRAMUSCULAR | Status: AC | PRN
  Administered 2016-02-04: 100 ug via INTRAVENOUS
  Administered 2016-02-04: 25 ug via INTRAVENOUS
  Administered 2016-02-04: 50 ug via INTRAVENOUS

## 2016-02-04 MED ORDER — BUPIVACAINE-EPINEPHRINE (PF) 0.25% -1:200000 IJ SOLN
INTRAMUSCULAR | Status: AC
  Filled 2016-02-04: qty 30

## 2016-02-04 MED ORDER — ONDANSETRON HCL 4 MG/2ML IJ SOLN: 4.0000 mg | INTRAMUSCULAR | Status: DC | PRN

## 2016-02-04 MED ORDER — LIDOCAINE HCL (CARDIAC) 20 MG/ML IV SOLN
INTRAVENOUS | Status: AC
  Filled 2016-02-04: qty 5

## 2016-02-04 MED ORDER — FENTANYL CITRATE (PF) 100 MCG/2ML IJ SOLN: 25.0000 ug | INTRAMUSCULAR | Status: DC | PRN

## 2016-02-04 MED ORDER — CEFAZOLIN SODIUM-DEXTROSE 2-3 GM-% IV SOLR
INTRAVENOUS | Status: AC
  Filled 2016-02-04: qty 50

## 2016-02-04 MED ORDER — CHLORHEXIDINE GLUCONATE 4 % EX LIQD: 1.0000 "application " | Freq: Once | CUTANEOUS | Status: DC

## 2016-02-04 MED ORDER — BUPIVACAINE HCL (PF) 0.5 % IJ SOLN
INTRAMUSCULAR | Status: AC
  Filled 2016-02-04: qty 30

## 2016-02-04 MED ORDER — PROPOFOL 10 MG/ML IV BOLUS
INTRAVENOUS | Status: AC
  Filled 2016-02-04: qty 20

## 2016-02-04 MED ORDER — DEXAMETHASONE SODIUM PHOSPHATE 10 MG/ML IJ SOLN
INTRAMUSCULAR | Status: AC
  Filled 2016-02-04: qty 1

## 2016-02-04 MED ORDER — PROPOFOL 10 MG/ML IV BOLUS
INTRAVENOUS | Status: DC | PRN
  Administered 2016-02-04: 200 mg via INTRAVENOUS

## 2016-02-04 MED ORDER — MIDAZOLAM HCL 2 MG/2ML IJ SOLN
1.0000 mg | INTRAMUSCULAR | Status: DC | PRN
  Administered 2016-02-04: 2 mg via INTRAVENOUS

## 2016-02-04 MED ORDER — GLYCOPYRROLATE 0.2 MG/ML IJ SOLN: 0.2000 mg | Freq: Once | INTRAMUSCULAR | Status: DC | PRN

## 2016-02-04 MED ORDER — BUPIVACAINE-EPINEPHRINE 0.25% -1:200000 IJ SOLN
INTRAMUSCULAR | Status: DC | PRN
  Administered 2016-02-04: 10 mL

## 2016-02-04 MED ORDER — OXYCODONE HCL 5 MG PO TABS
ORAL_TABLET | ORAL | Status: AC
  Filled 2016-02-04: qty 1

## 2016-02-04 MED ORDER — ONDANSETRON HCL 4 MG/2ML IJ SOLN
INTRAMUSCULAR | Status: AC
  Filled 2016-02-04: qty 2

## 2016-02-04 MED ORDER — FENTANYL CITRATE (PF) 100 MCG/2ML IJ SOLN
INTRAMUSCULAR | Status: AC
Start: 2016-02-04 — End: 2016-02-04
  Filled 2016-02-04: qty 2

## 2016-02-04 MED ORDER — LIDOCAINE HCL (CARDIAC) 20 MG/ML IV SOLN
INTRAVENOUS | Status: DC | PRN
  Administered 2016-02-04: 50 mg via INTRAVENOUS

## 2016-02-04 MED ORDER — ONDANSETRON HCL 4 MG/2ML IJ SOLN
INTRAMUSCULAR | Status: DC | PRN
  Administered 2016-02-04: 4 mg via INTRAVENOUS

## 2016-02-04 MED ORDER — CEFAZOLIN SODIUM-DEXTROSE 2-3 GM-% IV SOLR
2.0000 g | INTRAVENOUS | Status: AC
  Administered 2016-02-04: 2 g via INTRAVENOUS

## 2016-02-04 MED ORDER — OXYCODONE HCL 5 MG PO TABS
5.0000 mg | ORAL_TABLET | Freq: Once | ORAL | Status: AC | PRN
  Administered 2016-02-04: 5 mg via ORAL

## 2016-02-04 MED ORDER — ONDANSETRON HCL 4 MG/2ML IJ SOLN: 4.0000 mg | Freq: Four times a day (QID) | INTRAMUSCULAR | Status: DC | PRN

## 2016-02-04 MED ORDER — LACTATED RINGERS IV SOLN
INTRAVENOUS | Status: DC
  Administered 2016-02-04 (×2): via INTRAVENOUS

## 2016-02-04 MED ORDER — MIDAZOLAM HCL 2 MG/2ML IJ SOLN
INTRAMUSCULAR | Status: AC
  Filled 2016-02-04: qty 2

## 2016-02-04 MED ORDER — SCOPOLAMINE 1 MG/3DAYS TD PT72: 1.0000 | MEDICATED_PATCH | Freq: Once | TRANSDERMAL | Status: DC | PRN

## 2016-02-04 MED ORDER — DEXAMETHASONE SODIUM PHOSPHATE 4 MG/ML IJ SOLN
INTRAMUSCULAR | Status: DC | PRN
  Administered 2016-02-04: 10 mg via INTRAVENOUS

## 2016-02-04 SURGICAL SUPPLY — 53 items
APL SKNCLS STERI-STRIP NONHPOA (GAUZE/BANDAGES/DRESSINGS) ×1
BENZOIN TINCTURE PRP APPL 2/3 (GAUZE/BANDAGES/DRESSINGS) ×3 IMPLANT
BLADE CLIPPER SURG (BLADE) ×3 IMPLANT
BLADE HEX COATED 2.75 (ELECTRODE) ×3 IMPLANT
BLADE SURG 15 STRL LF DISP TIS (BLADE) ×1 IMPLANT
BLADE SURG 15 STRL SS (BLADE) ×3
CANISTER SUCT 1200ML W/VALVE (MISCELLANEOUS) IMPLANT
CHLORAPREP W/TINT 26ML (MISCELLANEOUS) ×3 IMPLANT
CLOSURE WOUND 1/2 X4 (GAUZE/BANDAGES/DRESSINGS) ×1
COVER BACK TABLE 60X90IN (DRAPES) ×3 IMPLANT
COVER MAYO STAND STRL (DRAPES) ×3 IMPLANT
DECANTER SPIKE VIAL GLASS SM (MISCELLANEOUS) ×1 IMPLANT
DRAPE LAPAROTOMY TRNSV 102X78 (DRAPE) ×3 IMPLANT
DRAPE UTILITY XL STRL (DRAPES) ×3 IMPLANT
DRSG TEGADERM 4X4.75 (GAUZE/BANDAGES/DRESSINGS) ×3 IMPLANT
ELECT REM PT RETURN 9FT ADLT (ELECTROSURGICAL) ×3
ELECTRODE REM PT RTRN 9FT ADLT (ELECTROSURGICAL) ×1 IMPLANT
GAUZE SPONGE 2X2 12PLY NS (GAUZE/BANDAGES/DRESSINGS) ×2 IMPLANT
GLOVE BIO SURGEON STRL SZ7 (GLOVE) ×3 IMPLANT
GLOVE BIOGEL PI IND STRL 7.0 (GLOVE) IMPLANT
GLOVE BIOGEL PI IND STRL 7.5 (GLOVE) ×1 IMPLANT
GLOVE BIOGEL PI INDICATOR 7.0 (GLOVE) ×4
GLOVE BIOGEL PI INDICATOR 7.5 (GLOVE) ×2
GLOVE ECLIPSE 6.5 STRL STRAW (GLOVE) ×2 IMPLANT
GOWN STRL REUS W/ TWL LRG LVL3 (GOWN DISPOSABLE) ×2 IMPLANT
GOWN STRL REUS W/TWL LRG LVL3 (GOWN DISPOSABLE) ×6
NDL HYPO 25X1 1.5 SAFETY (NEEDLE) ×1 IMPLANT
NEEDLE HYPO 25X1 1.5 SAFETY (NEEDLE) ×3 IMPLANT
NS IRRIG 1000ML POUR BTL (IV SOLUTION) IMPLANT
PACK BASIN DAY SURGERY FS (CUSTOM PROCEDURE TRAY) ×3 IMPLANT
PENCIL BUTTON HOLSTER BLD 10FT (ELECTRODE) ×3 IMPLANT
SLEEVE SCD COMPRESS KNEE MED (MISCELLANEOUS) ×3 IMPLANT
SPONGE GAUZE 4X4 12PLY STER LF (GAUZE/BANDAGES/DRESSINGS) ×1 IMPLANT
SPONGE INTESTINAL PEANUT (DISPOSABLE) IMPLANT
SPONGE LAP 18X18 X RAY DECT (DISPOSABLE) IMPLANT
SPONGE LAP 4X18 X RAY DECT (DISPOSABLE) ×3 IMPLANT
STRIP CLOSURE SKIN 1/2X4 (GAUZE/BANDAGES/DRESSINGS) ×2 IMPLANT
SUT MON AB 4-0 PC3 18 (SUTURE) ×3 IMPLANT
SUT SILK 2 0 SH (SUTURE) IMPLANT
SUT SILK 3 0 SH 30 (SUTURE) IMPLANT
SUT SILK 3 0 TIES 17X18 (SUTURE)
SUT SILK 3-0 18XBRD TIE BLK (SUTURE) IMPLANT
SUT VIC AB 0 SH 27 (SUTURE) IMPLANT
SUT VIC AB 2-0 SH 27 (SUTURE)
SUT VIC AB 2-0 SH 27XBRD (SUTURE) IMPLANT
SUT VIC AB 3-0 SH 27 (SUTURE) ×3
SUT VIC AB 3-0 SH 27X BRD (SUTURE) ×1 IMPLANT
SYR CONTROL 10ML LL (SYRINGE) ×3 IMPLANT
TOWEL OR 17X24 6PK STRL BLUE (TOWEL DISPOSABLE) ×3 IMPLANT
TOWEL OR NON WOVEN STRL DISP B (DISPOSABLE) ×1 IMPLANT
TUBE CONNECTING 20'X1/4 (TUBING)
TUBE CONNECTING 20X1/4 (TUBING) IMPLANT
YANKAUER SUCT BULB TIP NO VENT (SUCTIONS) IMPLANT
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
## 2016-02-04 NOTE — Anesthesia Postprocedure Evaluation (Signed)
Anesthesia Post Note  Patient: Steven Quinn  Procedure(s) Performed: Procedure(s) (LRB): LEFT INGUINAL LYMPH NODE BIOPSY (Left)  Patient location during evaluation: PACU Anesthesia Type: General Level of consciousness: awake and alert and patient cooperative Pain management: pain level controlled Vital Signs Assessment: post-procedure vital signs reviewed and stable Respiratory status: spontaneous breathing and respiratory function stable Cardiovascular status: stable Anesthetic complications: no    Last Vitals:  Filed Vitals:   02/04/16 1130 02/04/16 1145  BP: 143/92 119/91  Pulse: 60 51  Temp:    Resp: 16 15    Last Pain:  Filed Vitals:   02/04/16 1152  PainSc: 0-No pain                 [name]

## 2016-02-04 NOTE — Anesthesia Preprocedure Evaluation (Signed)
Anesthesia Evaluation  Patient identified by MRN, date of birth, ID band Patient awake    Reviewed: Allergy & Precautions, NPO status , Patient's Chart, lab work & pertinent test results  Airway Mallampati: II   Neck ROM: full    Dental   Pulmonary Current Smoker,    breath sounds clear to auscultation       Cardiovascular negative cardio ROS   Rhythm:regular Rate:Normal     Neuro/Psych  Headaches, Bipolar Disorder Schizophrenia    GI/Hepatic GERD  ,  Endo/Other    Renal/GU      Musculoskeletal   Abdominal   Peds  Hematology   Anesthesia Other Findings   Reproductive/Obstetrics                             Anesthesia Physical Anesthesia Plan  ASA: II  Anesthesia Plan: General   Post-op Pain Management:    Induction: Intravenous  Airway Management Planned: LMA  Additional Equipment:   Intra-op Plan:   Post-operative Plan:   Informed Consent: I have reviewed the patients History and Physical, chart, labs and discussed the procedure including the risks, benefits and alternatives for the proposed anesthesia with the patient or authorized representative who has indicated his/her understanding and acceptance.     Plan Discussed with: Anesthesiologist, CRNA and Surgeon  Anesthesia Plan Comments:         Anesthesia Quick Evaluation

## 2016-02-04 NOTE — Transfer of Care (Signed)
Immediate Anesthesia Transfer of Care Note  Patient: Steven Quinn  Procedure(s) Performed: Procedure(s): LEFT INGUINAL LYMPH NODE BIOPSY (Left)  Patient Location: PACU  Anesthesia Type:General  Level of Consciousness: awake and alert   Airway & Oxygen Therapy: Patient Spontanous Breathing and Patient connected to face mask oxygen  Post-op Assessment: Report given to RN and Post -op Vital signs reviewed and stable  Post vital signs: Reviewed and stable  Last Vitals:  Filed Vitals:   02/04/16 1009  BP: 125/73  Pulse: 64  Temp: 36.9 C  Resp: 18    Complications: No apparent anesthesia complications

## 2016-02-04 NOTE — H&P (View-Only) (Signed)
History of Present Illness Steven Quinn. Makoa Satz MD; 01/23/2016 11:19 AM) The patient is a 30 year old male who presents with lymphadenopathy. Referred by Dr. Murtis Sink for lymphadenopathy  This is a 30 year old male who presents with several years of palpable lymph nodes in both groins. However over the last year the left groin has become more tender. The pain radiates towards his testicle. According to Dr. Felipa Emory note, he has tested negative for HIV and other STDs. However he is positive for HSV 1. He also has described some left-sided jaw pain. He is able to palpate lymph nodes in both groins. He also is able to palpate lymph nodes on the posterior of both sides of his neck. With further questioning, the patient reports increasing levels of fatigue with minimal activity. He and his wife also report frequent night sweats where he wakes up soaked in sweat requiring changing of the bed sheets. This has been going on for over a year. He has had minimal weight loss. He continues to smoke 1.5-2 packs per day. No recent imaging.   Other Problems Fay Records, CMA; 01/23/2016 10:37 AM) Anxiety Disorder Arthritis Back Pain Chest pain Depression Gastric Ulcer Gastroesophageal Reflux Disease Migraine Headache  Past Surgical History Fay Records, CMA; 01/23/2016 10:37 AM) Appendectomy Oral Surgery  Diagnostic Studies History Fay Records, CMA; 01/23/2016 10:37 AM) Colonoscopy never  Allergies Fay Records, CMA; 01/23/2016 10:37 AM) Darvon-N *ANALGESICS - OPIOID*  Medication History Fay Records, CMA; 01/23/2016 10:37 AM) No Current Medications Medications Reconciled  Social History Fay Records, CMA; 01/23/2016 10:37 AM) Alcohol use Occasional alcohol use. Caffeine use Carbonated beverages, Coffee, Tea. Illicit drug use Prefer to discuss with provider. Tobacco use Current every day smoker.  Family History Fay Records, [place]; 01/23/2016 10:37 AM) Alcohol Abuse  Family Members In General. Depression Family Members In General. Diabetes Mellitus Family Members In General, Father, Mother. Hypertension Family Members In General, Father, Mother. Migraine Headache Father.     Review of Systems Fay Records CMA; 01/23/2016 10:37 AM) General Present- Appetite Loss, Fatigue and Night Sweats. Not Present- Chills, Fever, Weight Gain and Weight Loss. Skin Present- Non-Healing Wounds. Not Present- Change in Wart/Mole, Dryness, Hives, Jaundice, New Lesions, Rash and Ulcer. HEENT Present- Visual Disturbances. Not Present- Earache, Hearing Loss, Hoarseness, Nose Bleed, Oral Ulcers, Ringing in the Ears, Seasonal Allergies, Sinus Pain, Sore Throat, Wears glasses/contact lenses and Yellow Eyes. Respiratory Present- Difficulty Breathing and Wheezing. Not Present- Bloody sputum, Chronic Cough and Snoring. Cardiovascular Present- Chest Pain, Difficulty Breathing Lying Down, Leg Cramps and Shortness of Breath. Not Present- Palpitations, Rapid Heart Rate and Swelling of Extremities. Gastrointestinal Present- Abdominal Pain, Bloating, Chronic diarrhea, Gets full quickly at meals and Nausea. Not Present- Bloody Stool, Change in Bowel Habits, Constipation, Difficulty Swallowing, Excessive gas, Hemorrhoids, Indigestion, Rectal Pain and Vomiting. Male Genitourinary Present- Impotence, Nocturia and Painful Urination. Not Present- Blood in Urine, Change in Urinary Stream, Frequency, Urgency and Urine Leakage. Musculoskeletal Present- Back Pain, Joint Pain, Joint Stiffness, Muscle Pain and Muscle Weakness. Not Present- Swelling of Extremities. Neurological Present- Decreased Memory, Fainting and Headaches. Not Present- Numbness, Seizures, Tingling, Tremor, Trouble walking and Weakness. Psychiatric Present- Anxiety, Bipolar, Change in Sleep Pattern and Depression. Not Present- Fearful and Frequent crying. Endocrine Present- Hair Changes. Not Present- Cold Intolerance, Excessive  Hunger, Heat Intolerance, Hot flashes and New Diabetes. Hematology Present- Gland problems. Not Present- Easy Bruising, Excessive bleeding, HIV and Persistent Infections.  Vitals Fay Records CMA; 01/23/2016 10:38 AM) 01/23/2016 10:37 AM Weight: 169 lb Height:  73in Body Surface Area: 2 m Body Mass Index: 22.3 kg/m  Temp.: 97.56F  Pulse: 87 (Regular)  BP: 130/70 (Sitting, Left Arm, Standard)      Physical Exam Molli Hazard K. Bernise Sylvain MD; 01/23/2016 11:20 AM)  The physical exam findings are as follows: Note:WDWN in NAD HEENT: EOMI, sclera anicteric Neck: Tender over left TMJ; no swelling; bilateral posterior cervical lymphadenopathy Lungs: CTA bilaterally; normal respiratory effort; No palpable axillary lymphadenopathy CV: Regular rate and rhythm; no murmurs Abd: +bowel sounds, soft, non-tender, no masses GU: bilateral inguinal lymphadenopathy; largest is about 2 cm; tender on left No sign of inguinal hernia Ext: Well-perfused; no edema Skin: Warm, dry; no sign of jaundice    Assessment & Plan Molli Hazard K. Rexanna Louthan MD; 01/23/2016 11:20 AM)  LYMPHADENOPATHY, INGUINAL (R59.0) Impression: Generalized lymphadenopathy with worsening fatigue and night sweats concerning for lymphoma.  Current Plans Schedule for Surgery - left inguinal lymph node biopsy. The surgical procedure has been discussed with the patient. Potential risks, benefits, alternative treatments, and expected outcomes have been explained. All of the patient's questions at this time have been answered. The likelihood of reaching the patient's treatment goal is good. The patient understand the proposed surgical procedure and wishes to proceed. Started Percocet 5-325MG , 1 (one) Tablet every four hours, as needed, #40, 01/23/2016, No Refill.   Steven Quinn. Corliss Skains, MD, [facility]  General/ Trauma Surgery  01/23/2016 11:22 AM

## 2016-02-04 NOTE — Discharge Instructions (Signed)
Lymph Node BIopsy: POST OP INSTRUCTIONS  Always review your discharge instruction sheet given to you by the facility where your surgery was performed.   1. A prescription for pain medication may be given to you upon discharge. Take your pain medication as prescribed, if needed. If narcotic pain medicine is not needed, then you make take acetaminophen (Tylenol) or ibuprofen (Advil) as needed.  2. Take your usually prescribed medications unless otherwise directed. 3. If you need a refill on your pain medication, please contact our office. All narcotic pain medicine now requires a paper prescription.  Phoned in and fax refills are no longer allowed by law.  Prescriptions will not be filled after 5 pm or on weekends.  4. You should follow a light diet for the remainder of the day after your procedure. 5. Most patients will experience some mild swelling and/or bruising in the area of the incision. It may take several days to resolve. 6. It is common to experience some constipation if taking pain medication after surgery. Increasing fluid intake and taking a stool softener (such as Colace) will usually help or prevent this problem from occurring. A mild laxative (Milk of Magnesia or Miralax) should be taken according to package directions if there are no bowel movements after 48 hours.  Unless discharge instructions indicate otherwise, you may remove your bandages 48 hours after surgery, and you may shower at that time. You may have steri-strips (small white skin tapes) in place directly over the incision.  These strips should be left on the skin for 7-10 days.       .  WHEN TO CALL YOUR DOCTOR [phone]): 1. Fever over 101.0 2. Chills 3. Continued bleeding from incision 4. Increased redness and tenderness at the site 5. Shortness of breath, difficulty breathing   The clinic staff is available to answer your questions during regular business hours. Please dont hesitate to call and ask to  speak to one of the nurses or medical assistants for clinical concerns. If you have a medical emergency, go to the nearest emergency room or call 911.  A surgeon from [facility] is always on call at the hospital.     For further information, please visit [email or web address]      Post Anesthesia Home Care Instructions  Activity: Get plenty of rest for the remainder of the day. A responsible adult should stay with you for 24 hours following the procedure.  For the next 24 hours, DO NOT: -Drive a car -Advertising copywriter -Drink alcoholic beverages -Take any medication unless instructed by your physician -Make any legal decisions or sign important papers.  Meals: Start with liquid foods such as gelatin or soup. Progress to regular foods as tolerated. Avoid greasy, spicy, heavy foods. If nausea and/or vomiting occur, drink only clear liquids until the nausea and/or vomiting subsides. Call your physician if vomiting continues.  Special Instructions/Symptoms: Your throat may feel dry or sore from the anesthesia or the breathing tube placed in your throat during surgery. If this causes discomfort, gargle with warm salt water. The discomfort should disappear within 24 hours.  If you had a scopolamine patch placed behind your ear for the management of post- operative nausea and/or vomiting:  1. The medication in the patch is effective for 72 hours, after which it should be removed.  Wrap patch in a tissue and discard in the trash. Wash hands thoroughly with soap and water. 2. You may remove the patch earlier than 72 hours if  you experience unpleasant side effects which may include dry mouth, dizziness or visual disturbances. 3. Avoid touching the patch. Wash your hands with soap and water after contact with the patch.

## 2016-02-04 NOTE — Op Note (Signed)
Preop diagnosis: Generalized lymphadenopathy Postop diagnosis: Same Procedure performed: Left inguinal lymph node biopsy Surgeon:Allyah Heather K. Anesthesia: Gen. via LMA Indications: This is a 30 year old male who presents with several years of palpable lymph nodes in both groins. The left groin has enlarged and become more tender. He has palpable lymph nodes in both sides of his neck as well. She reports increasing levels of fatigue and frequent night sweats. He presents now for lymph node biopsy to rule out lymphoma.  Description of procedure: Patient brought to the operating room and placed in a supine position on the operative table. After adequate level of general anesthesia was obtained, his left groin was shaved prepped with ChloraPrep and draped in sterile fashion. A timeout was taken to ensure the proper patient and proper procedure. A cluster of lymph nodes was palpated and we made our incision directly over this area. First we infiltrated with 0.25% Marcaine with epinephrine. Dissection was carried down to the substance tissues until we encountered the lymph nodes. There was a cluster of about 3 lymph nodes and these were all excised together. These were sent for pathologic examination. I inspected the wound carefully and no other palpable lymph nodes are noted in the medial area. There is no sign of inguinal hernia on examination. We closed wound with 3-0 Vicryl and 4-0 Monocryl. Steri-Strips and clean dressings were applied. The patient was then next made about the recovery room in stable condition. All sponge, initially, and needle counts are correct.  Wilmon Arms. Corliss Skains, MD, [facility]  General/ Trauma Surgery  02/04/2016 11:23 AM

## 2016-02-04 NOTE — Anesthesia Procedure Notes (Signed)
Procedure Name: LMA Insertion Date/Time: 02/04/2016 10:49 AM Performed by: Caren Macadam Pre-anesthesia Checklist: Patient identified, Emergency Drugs available, Suction available and Patient being monitored Patient Re-evaluated:Patient Re-evaluated prior to inductionOxygen Delivery Method: Circle System Utilized Preoxygenation: Pre-oxygenation with 100% oxygen Intubation Type: IV induction Ventilation: Mask ventilation without difficulty LMA: LMA inserted LMA Size: 4.0 Number of attempts: 1 Airway Equipment and Method: Bite block Placement Confirmation: positive ETCO2 and breath sounds checked- equal and bilateral Tube secured with: Tape Dental Injury: Teeth and Oropharynx as per pre-operative assessment

## 2016-02-05 ENCOUNTER — Encounter (HOSPITAL_BASED_OUTPATIENT_CLINIC_OR_DEPARTMENT_OTHER): Payer: Self-pay | Admitting: Surgery

## 2016-02-27 ENCOUNTER — Encounter: Payer: Self-pay | Admitting: Family Medicine

## 2016-02-27 ENCOUNTER — Ambulatory Visit (INDEPENDENT_AMBULATORY_CARE_PROVIDER_SITE_OTHER): Payer: Medicare Other | Admitting: Family Medicine

## 2016-02-27 ENCOUNTER — Other Ambulatory Visit: Payer: Self-pay

## 2016-02-27 VITALS — BP 125/71 | HR 79 | Temp 97.0°F | Ht 73.0 in | Wt 170.6 lb

## 2016-02-27 DIAGNOSIS — N50812 Left testicular pain: Secondary | ICD-10-CM | POA: Insufficient documentation

## 2016-02-27 DIAGNOSIS — N50819 Testicular pain, unspecified: Secondary | ICD-10-CM

## 2016-02-27 DIAGNOSIS — R1032 Left lower quadrant pain: Secondary | ICD-10-CM | POA: Diagnosis not present

## 2016-02-27 DIAGNOSIS — N5082 Scrotal pain: Secondary | ICD-10-CM

## 2016-02-27 DIAGNOSIS — B351 Tinea unguium: Secondary | ICD-10-CM | POA: Diagnosis not present

## 2016-02-27 MED ORDER — NAPROXEN 500 MG PO TABS: 500.0000 mg | ORAL_TABLET | Freq: Two times a day (BID) | ORAL | Status: DC

## 2016-02-27 MED ORDER — TERBINAFINE HCL 250 MG PO TABS: 250.0000 mg | ORAL_TABLET | Freq: Every day | ORAL | Status: DC

## 2016-02-27 NOTE — Patient Instructions (Signed)
Great to see you!  I have sent naproxyn for pain  Terbinafine for his toenail infection  We will call with labs and to arrange the ultrasound

## 2016-02-27 NOTE — Progress Notes (Signed)
   HPI  Patient presents today here for follow-up inguinal pain and toenail fungus.  Patient has recently had lymph nodes removed in his left inguinal area, he continues to have distal inguinal and testicle pain. He describes intermittent pain is different from his testicles were hip. He denies any other abnormalities  He does have mid line low back pain however it does not seem to radiate to the groin.  He's been getting Percocet prescriptions from the surgery practice since his surgery. He requests additional medicine today, I declined this todayHe's received 5 prescriptions from surgery practice over the last 6 weeks  I did offer tramadol, he states that this adult health previously   PMH: Smoking status noted ROS: Per HPI  Objective: BP 125/71 mmHg  Pulse 79  Temp(Src) 97 F (36.1 C) (Oral)  Ht _0  (1.854 m)  Wt 170 lb 9.6 oz (77.384 kg)  BMI 22.51 kg/m2 Gen: NAD, alert, cooperative with exam HEENT: NCAT CV: RRR, good S1/S2, no murmur Resp: CTABL, no wheezes, non-labored GU: tenderess to palp of the l inguinal area without LN palpable, healing L inguinal scar, no abnormalities of the scrotum apparent Ext: No edema, warm Neuro: Alert and oriented, No gross deficits  Assessment and plan:  # Inguinal/scrotal pain [place] ordered, considering benign path of nodes I am concerning MRI to investigate further- this was recommended by surgery- I appreciate thir assitance I have declined narcotics Naproxen for pain  # onychomycosis Tx with lamisil CMP and CBC today POssibly related to groin tenderness but very unlikely    Orders Placed This Encounter  Procedures  . US Scrotum    Standing Status: Future     Number of Occurrences:      Standing Expiration Date: 04/28/2017    Order Specific Question:  Reason for Exam (SYMPTOM  OR DIAGNOSIS REQUIRED)    Answer:  testicular pain    Order Specific Question:  Preferred imaging location?    Answer:  [place] CBC with Differential    Meds ordered this encounter  Medications  . terbinafine (LAMISIL) 250 MG tablet    Sig: Take 1 tablet (250 mg total) by mouth daily.    Dispense:  30 tablet    Refill:  2  . naproxen (NAPROSYN) 500 MG tablet    Sig: Take 1 tablet (500 mg total) by mouth 2 (two) times daily with a meal.    Dispense:  30 tablet    Refill:  0    Laroy Apple, MD [facility] 02/27/2016, 5:11 PM

## 2016-02-28 LAB — CMP14+EGFR
ALBUMIN: 4.7 g/dL (ref 3.5–5.5)
ALK PHOS: 67 IU/L (ref 39–117)
ALT: 22 IU/L (ref 0–44)
AST: 19 IU/L (ref 0–40)
Albumin/Globulin Ratio: 2 (ref 1.2–2.2)
BILIRUBIN TOTAL: 0.4 mg/dL (ref 0.0–1.2)
BUN / CREAT RATIO: 6 — AB (ref 8–19)
BUN: 5 mg/dL — ABNORMAL LOW (ref 6–20)
CHLORIDE: 99 mmol/L (ref 96–106)
CO2: 25 mmol/L (ref 18–29)
CREATININE: 0.8 mg/dL (ref 0.76–1.27)
Calcium: 9.7 mg/dL (ref 8.7–10.2)
GFR calc non Af Amer: 119 mL/min/{1.73_m2} (ref >59)
GFR, EST AFRICAN AMERICAN: 138 mL/min/{1.73_m2} (ref >59)
GLOBULIN, TOTAL: 2.3 g/dL (ref 1.5–4.5)
Glucose: 83 mg/dL (ref 65–99)
Potassium: 3.9 mmol/L (ref 3.5–5.2)
SODIUM: 141 mmol/L (ref 134–144)
TOTAL PROTEIN: 7 g/dL (ref 6.0–8.5)

## 2016-02-28 LAB — CBC WITH DIFFERENTIAL/PLATELET
Basophils Absolute: 0 10*3/uL (ref 0.0–0.2)
Basos: 0 %
EOS (ABSOLUTE): 0.2 10*3/uL (ref 0.0–0.4)
EOS: 1 %
HEMOGLOBIN: 15.3 g/dL (ref 12.6–17.7)
Hematocrit: 44.1 % (ref 37.5–51.0)
Immature Grans (Abs): 0 10*3/uL (ref 0.0–0.1)
Immature Granulocytes: 0 %
LYMPHS ABS: 3.4 10*3/uL — AB (ref 0.7–3.1)
Lymphs: 24 %
MCH: 30.4 pg (ref 26.6–33.0)
MCHC: 34.7 g/dL (ref 31.5–35.7)
MCV: 88 fL (ref 79–97)
MONOS ABS: 1.6 10*3/uL — AB (ref 0.1–0.9)
Monocytes: 12 %
NEUTROS ABS: 8.9 10*3/uL — AB (ref 1.4–7.0)
Neutrophils: 63 %
Platelets: 202 10*3/uL (ref 150–379)
RBC: 5.04 x10E6/uL (ref 4.14–5.80)
RDW: 13.8 % (ref 12.3–15.4)
WBC: 14.1 10*3/uL — AB (ref 3.4–10.8)

## 2016-03-02 ENCOUNTER — Encounter: Payer: Self-pay | Admitting: *Deleted

## 2016-03-02 ENCOUNTER — Other Ambulatory Visit: Payer: Self-pay | Admitting: Family Medicine

## 2016-03-02 ENCOUNTER — Telehealth: Payer: Self-pay | Admitting: Family Medicine

## 2016-03-02 DIAGNOSIS — R61 Generalized hyperhidrosis: Secondary | ICD-10-CM

## 2016-03-02 NOTE — Telephone Encounter (Signed)
Patient aware of lab results and need for TB Interferon, he will come by for lab draw

## 2016-03-03 ENCOUNTER — Ambulatory Visit (HOSPITAL_COMMUNITY)
Admission: RE | Admit: 2016-03-03 | Discharge: 2016-03-03 | Disposition: A | Discharge status: Home / Self Care | Payer: Medicare Other | Source: Ambulatory Visit | Attending: Family Medicine | Admitting: Family Medicine

## 2016-03-03 DIAGNOSIS — N503 Cyst of epididymis: Secondary | ICD-10-CM | POA: Diagnosis not present

## 2016-03-03 DIAGNOSIS — N433 Hydrocele, unspecified: Secondary | ICD-10-CM | POA: Insufficient documentation

## 2016-03-03 DIAGNOSIS — N5082 Scrotal pain: Secondary | ICD-10-CM | POA: Diagnosis not present

## 2016-03-03 DIAGNOSIS — N50819 Testicular pain, unspecified: Secondary | ICD-10-CM | POA: Insufficient documentation

## 2016-03-04 ENCOUNTER — Telehealth: Payer: Self-pay | Admitting: Family Medicine

## 2016-03-04 DIAGNOSIS — R590 Localized enlarged lymph nodes: Secondary | ICD-10-CM

## 2016-03-04 NOTE — Telephone Encounter (Signed)
Ordering CT of the abdomen and pelvis to investigate we will lymphadenopathy, this is been persistent as evidenced by CT scan for 4 years now, status post surgical removal but now persistent pain.  I discussed this with radiology who recommended CT before MRI.  Murtis SinkSam Ana Liaw, MD [facility] 03/04/2016, 4:10 PM

## 2016-03-10 ENCOUNTER — Other Ambulatory Visit: Payer: Medicare Other

## 2016-03-10 DIAGNOSIS — R61 Generalized hyperhidrosis: Secondary | ICD-10-CM | POA: Diagnosis not present

## 2016-03-11 ENCOUNTER — Ambulatory Visit (HOSPITAL_COMMUNITY)
Admission: RE | Admit: 2016-03-11 | Discharge: 2016-03-11 | Disposition: A | Discharge status: Home / Self Care | Payer: Medicare Other | Source: Ambulatory Visit | Attending: Family Medicine | Admitting: Family Medicine

## 2016-03-11 DIAGNOSIS — R52 Pain, unspecified: Secondary | ICD-10-CM | POA: Diagnosis not present

## 2016-03-11 DIAGNOSIS — R599 Enlarged lymph nodes, unspecified: Secondary | ICD-10-CM | POA: Diagnosis not present

## 2016-03-11 DIAGNOSIS — R590 Localized enlarged lymph nodes: Secondary | ICD-10-CM

## 2016-03-11 DIAGNOSIS — R1032 Left lower quadrant pain: Secondary | ICD-10-CM | POA: Diagnosis not present

## 2016-03-11 MED ORDER — IOHEXOL 300 MG/ML  SOLN
75.0000 mL | Freq: Once | INTRAMUSCULAR | Status: AC | PRN
  Administered 2016-03-11: 75 mL via INTRAVENOUS

## 2016-03-12 LAB — QUANTIFERON IN TUBE
QFT TB AG MINUS NIL VALUE: <0 IU/mL
QUANTIFERON MITOGEN VALUE: 9.92 IU/mL
QUANTIFERON NIL VALUE: 0.12 [IU]/mL
QUANTIFERON TB AG VALUE: 0.09 IU/mL
QUANTIFERON TB GOLD: NEGATIVE

## 2016-03-12 LAB — QUANTIFERON TB GOLD ASSAY (BLOOD)

## 2016-04-01 ENCOUNTER — Ambulatory Visit (INDEPENDENT_AMBULATORY_CARE_PROVIDER_SITE_OTHER): Payer: Medicare Other | Admitting: Family Medicine

## 2016-04-01 ENCOUNTER — Encounter: Payer: Self-pay | Admitting: Family Medicine

## 2016-04-01 ENCOUNTER — Encounter (INDEPENDENT_AMBULATORY_CARE_PROVIDER_SITE_OTHER): Payer: Self-pay

## 2016-04-01 VITALS — BP 130/74 | HR 79 | Temp 97.5°F | Ht 73.0 in | Wt 167.8 lb

## 2016-04-01 DIAGNOSIS — N5089 Other specified disorders of the male genital organs: Secondary | ICD-10-CM | POA: Diagnosis not present

## 2016-04-01 MED ORDER — CYCLOBENZAPRINE HCL 10 MG PO TABS: 10.0000 mg | ORAL_TABLET | Freq: Three times a day (TID) | ORAL | Status: DC | PRN

## 2016-04-01 NOTE — Progress Notes (Signed)
   HPI  Patient presents today here for continued pelvic pain.  Patient explains that it seems like the pain is coming from the cord that comes from his left testicle up to his groin. He describes the pain as a sharp pulling type pain, pulling on his left testicle radiating up into his abdomen. He denies any fever, chills, or recent trauma.  He had persistent lymphadenopathy which was addressed by general surgery, they were found to be only reactive on pathology.  Has some intermittent neck pain, it seems to hurt worse with turning his head, it has improved today but is bothered more than last her 3 weeks.  He feels like the pain is now also radiating back to his left side. Leg weakness, numbness, or tingling. He did have an episode of leg numbness after squatting for 10 or 15 minutes to help a friend work on a car, however it has not recurred since then  PMH: Smoking status noted ROS: Per HPI  Objective: BP 130/74 mmHg  Pulse 79  Temp(Src) 97.5 F (36.4 C) (Oral)  Ht 6\' 1"  (1.854 m)  Wt 167 lb 12.8 oz (76.114 kg)  BMI 22.14 kg/m2 Gen: NAD, alert, cooperative with exam HEENT: NCAT Abd: SNTND, BS present, no guarding or organomegaly Ext: No edema, warm Neuro: Alert and oriented, drink 5/5 and sensation intact in both lower extremities GU: Tenderness to palpation of the left sided spermatic cord  Musculoskeletal Tenderness to palpation of the left-sided SI joint-mild   Assessment and plan:  # Pelvic pain, spermatic cord pain Considering his persistent pain and recent workup ongoing ago ahead and try to get another opinion from a urologist, today is the most clear I think that he's ever had pain whenever I rolled the spermatic cord Flexeril for muscle spasms and as an alternative option for pain I recommended avoiding narcotics for chronic pain to him      Orders Placed This Encounter  Procedures  . Ambulatory referral to Urology    Referral Priority:  Routine   Referral Type:  Consultation    Referral Reason:  Specialty Services Required    Requested Specialty:  Urology    Number of Visits Requested:  1    Meds ordered this encounter  Medications  . cyclobenzaprine (FLEXERIL) 10 MG tablet    Sig: Take 1 tablet (10 mg total) by mouth 3 (three) times daily as needed for muscle spasms.    Dispense:  30 tablet    Refill:  0    Murtis SinkSam Bradshaw, MD [facility] 04/01/2016, 10:02 AM

## 2016-04-01 NOTE — Patient Instructions (Signed)
Great to see you!  I have set up a referral for urology considering where most of your pain is.   Try the flexeril for pain, also try it if the muscle spasm returns. Itis a muscle relaxer and may make you sleepy.

## 2016-04-19 ENCOUNTER — Encounter: Payer: Self-pay | Admitting: Family Medicine

## 2016-04-28 ENCOUNTER — Ambulatory Visit (INDEPENDENT_AMBULATORY_CARE_PROVIDER_SITE_OTHER): Payer: Medicare Other | Admitting: Urology

## 2016-04-28 DIAGNOSIS — M6289 Other specified disorders of muscle: Secondary | ICD-10-CM | POA: Diagnosis not present

## 2016-04-28 DIAGNOSIS — N5089 Other specified disorders of the male genital organs: Secondary | ICD-10-CM | POA: Diagnosis not present

## 2016-04-29 ENCOUNTER — Encounter: Payer: Self-pay | Admitting: Family

## 2016-04-29 ENCOUNTER — Ambulatory Visit (INDEPENDENT_AMBULATORY_CARE_PROVIDER_SITE_OTHER): Payer: Medicare Other | Admitting: Family

## 2016-04-29 VITALS — BP 118/77 | HR 87 | Temp 97.4°F | Ht 73.0 in | Wt 161.0 lb

## 2016-04-29 DIAGNOSIS — L03031 Cellulitis of right toe: Secondary | ICD-10-CM | POA: Diagnosis not present

## 2016-04-29 DIAGNOSIS — M109 Gout, unspecified: Secondary | ICD-10-CM | POA: Diagnosis not present

## 2016-04-29 DIAGNOSIS — M7989 Other specified soft tissue disorders: Secondary | ICD-10-CM | POA: Diagnosis not present

## 2016-04-29 MED ORDER — CEFTRIAXONE SODIUM 1 G IJ SOLR
1.0000 g | Freq: Once | INTRAMUSCULAR | Status: AC
  Administered 2016-04-29: 1 g via INTRAMUSCULAR

## 2016-04-29 MED ORDER — TRAMADOL HCL 50 MG PO TABS: 50.0000 mg | ORAL_TABLET | Freq: Three times a day (TID) | ORAL | Status: DC | PRN

## 2016-04-29 MED ORDER — CEPHALEXIN 500 MG PO CAPS: 500.0000 mg | ORAL_CAPSULE | Freq: Three times a day (TID) | ORAL | Status: DC

## 2016-04-29 NOTE — Addendum Note (Signed)
Addended by: Jannifer RodneyHAWKS, CHRISTY A on: 04/29/2016 01:10 PM   Modules accepted: Orders

## 2016-04-29 NOTE — Progress Notes (Signed)
   Subjective:    Patient ID: Steven Quinn, male    DOB: [birth year], 30 y.o.   MRN: [id number]  Toe Pain  The incident occurred 12 to 24 hours ago. There was no injury mechanism. Pain location: right great toe. The quality of the pain is described as aching. The pain is at a severity of 10/10. The pain is moderate. The pain has been constant since onset. Associated symptoms include an inability to bear weight. Pertinent negatives include no numbness. He reports no foreign bodies present. The symptoms are aggravated by weight bearing and movement. He has tried acetaminophen, ice, elevation and NSAIDs for the symptoms. The treatment provided mild relief.      Review of Systems  Neurological: Negative for numbness.  All other systems reviewed and are negative.      Objective:   Physical Exam  Constitutional: He is oriented to person, place, and time. He appears well-developed and well-nourished. No distress.  HENT:  Head: Normocephalic.  Eyes: Pupils are equal, round, and reactive to light. Right eye exhibits no discharge. Left eye exhibits no discharge.  Neck: Normal range of motion. Neck supple. No thyromegaly present.  Cardiovascular: Normal rate, regular rhythm, normal heart sounds and intact distal pulses.   No murmur heard. Pulmonary/Chest: Effort normal and breath sounds normal. No respiratory distress. He has no wheezes.  Abdominal: Soft. Bowel sounds are normal. He exhibits no distension. There is no tenderness.  Musculoskeletal: Normal range of motion. He exhibits edema and tenderness.  Right great toe pain and moderate swelling, area marked  Neurological: He is alert and oriented to person, place, and time. He has normal reflexes. No cranial nerve deficit.  Skin: Skin is warm and dry. No rash noted. No erythema.  Psychiatric: He has a normal mood and affect. His behavior is normal. Judgment and thought content normal.  Vitals reviewed.    BP 118/77 mmHg  Pulse 87   Temp(Src) 97.4 F (36.3 C) (Oral)  Ht 6\' 1"  (1.854 m)  Wt 161 lb (73.029 kg)  BMI 21.25 kg/m2      Assessment & Plan:  1. Cellulitis of great toe, right -Keep clean and dry -Keep elevated -Report any s/s of increased redness, swelling, fever, or pain -RTO in 7  days - cefTRIAXone (ROCEPHIN) injection 1 g; Inject 1 g into the muscle once. - cephALEXin (KEFLEX) 500 MG capsule; Take 1 capsule (500 mg total) by mouth 3 (three) times daily.  Dispense: 21 capsule; Refill: 0  Jannifer Rodneyhristy Natsha Guidry, FNP

## 2016-04-29 NOTE — Patient Instructions (Signed)

## 2016-04-29 NOTE — Addendum Note (Signed)
Addended by: Jannifer RodneyHAWKS, Alivya Wegman A on: 04/29/2016 01:08 PM   Modules accepted: Orders, Level of Service

## 2016-04-30 LAB — CMP14+EGFR
A/G RATIO: 1.8 (ref 1.2–2.2)
ALT: 21 IU/L (ref 0–44)
AST: 22 IU/L (ref 0–40)
Albumin: 4.6 g/dL (ref 3.5–5.5)
Alkaline Phosphatase: 74 IU/L (ref 39–117)
BILIRUBIN TOTAL: 0.3 mg/dL (ref 0.0–1.2)
BUN/Creatinine Ratio: 8 — ABNORMAL LOW (ref 9–20)
BUN: 6 mg/dL (ref 6–20)
CHLORIDE: 98 mmol/L (ref 96–106)
CO2: 27 mmol/L (ref 18–29)
Calcium: 9.4 mg/dL (ref 8.7–10.2)
Creatinine, Ser: 0.76 mg/dL (ref 0.76–1.27)
GFR calc non Af Amer: 122 mL/min/{1.73_m2} (ref >59)
GFR, EST AFRICAN AMERICAN: 141 mL/min/{1.73_m2} (ref >59)
Globulin, Total: 2.5 g/dL (ref 1.5–4.5)
Glucose: 99 mg/dL (ref 65–99)
POTASSIUM: 4.3 mmol/L (ref 3.5–5.2)
Sodium: 140 mmol/L (ref 134–144)
TOTAL PROTEIN: 7.1 g/dL (ref 6.0–8.5)

## 2016-04-30 LAB — URIC ACID: Uric Acid: 6.1 mg/dL (ref 3.7–8.6)

## 2016-05-04 ENCOUNTER — Encounter: Payer: Self-pay | Admitting: Family Medicine

## 2016-05-06 ENCOUNTER — Ambulatory Visit: Payer: Medicare Other | Admitting: Family Medicine

## 2016-06-02 ENCOUNTER — Ambulatory Visit (INDEPENDENT_AMBULATORY_CARE_PROVIDER_SITE_OTHER): Payer: Medicare Other | Admitting: Urology

## 2016-06-02 DIAGNOSIS — R102 Pelvic and perineal pain: Secondary | ICD-10-CM

## 2016-07-07 ENCOUNTER — Ambulatory Visit (INDEPENDENT_AMBULATORY_CARE_PROVIDER_SITE_OTHER): Payer: Medicare Other | Admitting: Urology

## 2016-07-07 DIAGNOSIS — R102 Pelvic and perineal pain: Secondary | ICD-10-CM

## 2016-07-21 ENCOUNTER — Other Ambulatory Visit: Payer: Self-pay | Admitting: Urology

## 2016-07-22 ENCOUNTER — Encounter (HOSPITAL_BASED_OUTPATIENT_CLINIC_OR_DEPARTMENT_OTHER): Payer: Self-pay | Admitting: *Deleted

## 2016-07-22 NOTE — Progress Notes (Signed)
NPO AFTER MN  WITH EXCEPTION CLEAR LIQUIDS UNTIL 0730 (NO CREAM /MILK PRODUCTS).  ARRIVE AT 1215.  NEEDS HG.  MAY TAKE OXYCODONE AM DOS W/ SIPS OF WATER.

## 2016-07-26 ENCOUNTER — Ambulatory Visit (HOSPITAL_BASED_OUTPATIENT_CLINIC_OR_DEPARTMENT_OTHER)
Admission: RE | Admit: 2016-07-26 | Discharge: 2016-07-26 | Disposition: A | Discharge status: Home / Self Care | Payer: Medicare Other | Source: Ambulatory Visit | Attending: Urology | Admitting: Urology

## 2016-07-26 ENCOUNTER — Encounter (HOSPITAL_BASED_OUTPATIENT_CLINIC_OR_DEPARTMENT_OTHER): Payer: Self-pay | Admitting: *Deleted

## 2016-07-26 ENCOUNTER — Encounter (HOSPITAL_BASED_OUTPATIENT_CLINIC_OR_DEPARTMENT_OTHER)
Admission: RE | Disposition: A | Discharge status: Home / Self Care | Payer: Self-pay | Source: Ambulatory Visit | Attending: Urology

## 2016-07-26 ENCOUNTER — Ambulatory Visit (HOSPITAL_BASED_OUTPATIENT_CLINIC_OR_DEPARTMENT_OTHER): Payer: Medicare Other | Admitting: Anesthesiology

## 2016-07-26 DIAGNOSIS — I861 Scrotal varices: Secondary | ICD-10-CM | POA: Insufficient documentation

## 2016-07-26 DIAGNOSIS — N50812 Left testicular pain: Secondary | ICD-10-CM | POA: Insufficient documentation

## 2016-07-26 DIAGNOSIS — K219 Gastro-esophageal reflux disease without esophagitis: Secondary | ICD-10-CM | POA: Diagnosis not present

## 2016-07-26 DIAGNOSIS — F17210 Nicotine dependence, cigarettes, uncomplicated: Secondary | ICD-10-CM | POA: Insufficient documentation

## 2016-07-26 DIAGNOSIS — Z79899 Other long term (current) drug therapy: Secondary | ICD-10-CM | POA: Diagnosis not present

## 2016-07-26 HISTORY — PX: VARICOCELECTOMY: SHX1084

## 2016-07-26 HISTORY — DX: Personal history of other specified conditions: Z87.898

## 2016-07-26 HISTORY — DX: Left testicular pain: N50.812

## 2016-07-26 HISTORY — DX: Hesitancy of micturition: R39.11

## 2016-07-26 LAB — POCT HEMOGLOBIN-HEMACUE: HEMOGLOBIN: 15.3 g/dL (ref 13.0–17.0)

## 2016-07-26 SURGERY — EXCISION, VARICOCELE
Anesthesia: General | Laterality: Left

## 2016-07-26 MED ORDER — PROPOFOL 10 MG/ML IV BOLUS
INTRAVENOUS | Status: DC | PRN
  Administered 2016-07-26: 200 mg via INTRAVENOUS

## 2016-07-26 MED ORDER — ONDANSETRON HCL 4 MG/2ML IJ SOLN
INTRAMUSCULAR | Status: DC | PRN
  Administered 2016-07-26: 4 mg via INTRAVENOUS

## 2016-07-26 MED ORDER — MIDAZOLAM HCL 2 MG/2ML IJ SOLN
INTRAMUSCULAR | Status: AC
  Filled 2016-07-26: qty 2

## 2016-07-26 MED ORDER — SODIUM CHLORIDE 0.9 % IR SOLN
Status: DC | PRN
  Administered 2016-07-26: 500 mL

## 2016-07-26 MED ORDER — OXYCODONE-ACETAMINOPHEN 10-325 MG PO TABS: 1.0000 | ORAL_TABLET | ORAL | 0 refills | Status: DC | PRN

## 2016-07-26 MED ORDER — HYDROCODONE-ACETAMINOPHEN 7.5-325 MG PO TABS
ORAL_TABLET | ORAL | Status: AC
  Filled 2016-07-26: qty 1

## 2016-07-26 MED ORDER — ONDANSETRON HCL 4 MG/2ML IJ SOLN
INTRAMUSCULAR | Status: AC
  Filled 2016-07-26: qty 2

## 2016-07-26 MED ORDER — FENTANYL CITRATE (PF) 100 MCG/2ML IJ SOLN
INTRAMUSCULAR | Status: DC | PRN
  Administered 2016-07-26 (×4): 25 ug via INTRAVENOUS
  Administered 2016-07-26 (×2): 50 ug via INTRAVENOUS

## 2016-07-26 MED ORDER — FENTANYL CITRATE (PF) 100 MCG/2ML IJ SOLN
25.0000 ug | INTRAMUSCULAR | Status: DC | PRN
  Filled 2016-07-26: qty 1

## 2016-07-26 MED ORDER — PROMETHAZINE HCL 25 MG/ML IJ SOLN
6.2500 mg | INTRAMUSCULAR | Status: DC | PRN
  Filled 2016-07-26: qty 1

## 2016-07-26 MED ORDER — LACTATED RINGERS IV SOLN
INTRAVENOUS | Status: DC
  Administered 2016-07-26 (×2): via INTRAVENOUS
  Filled 2016-07-26: qty 1000

## 2016-07-26 MED ORDER — DEXAMETHASONE SODIUM PHOSPHATE 4 MG/ML IJ SOLN
INTRAMUSCULAR | Status: DC | PRN
  Administered 2016-07-26: 10 mg via INTRAVENOUS

## 2016-07-26 MED ORDER — DEXAMETHASONE SODIUM PHOSPHATE 10 MG/ML IJ SOLN
INTRAMUSCULAR | Status: AC
  Filled 2016-07-26: qty 1

## 2016-07-26 MED ORDER — MIDAZOLAM HCL 5 MG/5ML IJ SOLN
INTRAMUSCULAR | Status: DC | PRN
  Administered 2016-07-26: 2 mg via INTRAVENOUS

## 2016-07-26 MED ORDER — CEFAZOLIN SODIUM-DEXTROSE 2-4 GM/100ML-% IV SOLN
2.0000 g | Freq: Once | INTRAVENOUS | Status: AC
  Administered 2016-07-26: 2 g via INTRAVENOUS
  Filled 2016-07-26: qty 100

## 2016-07-26 MED ORDER — LIDOCAINE 2% (20 MG/ML) 5 ML SYRINGE
INTRAMUSCULAR | Status: DC | PRN
  Administered 2016-07-26: 60 mg via INTRAVENOUS

## 2016-07-26 MED ORDER — FENTANYL CITRATE (PF) 100 MCG/2ML IJ SOLN
INTRAMUSCULAR | Status: AC
  Filled 2016-07-26: qty 2

## 2016-07-26 MED ORDER — BUPIVACAINE HCL (PF) 0.25 % IJ SOLN
INTRAMUSCULAR | Status: DC | PRN
  Administered 2016-07-26: 10 mL

## 2016-07-26 MED ORDER — PROPOFOL 10 MG/ML IV BOLUS
INTRAVENOUS | Status: AC
  Filled 2016-07-26: qty 20

## 2016-07-26 MED ORDER — LIDOCAINE HCL (CARDIAC) 20 MG/ML IV SOLN
INTRAVENOUS | Status: AC
  Filled 2016-07-26: qty 5

## 2016-07-26 MED ORDER — HYDROCODONE-ACETAMINOPHEN 7.5-325 MG PO TABS
1.0000 | ORAL_TABLET | Freq: Once | ORAL | Status: AC | PRN
  Administered 2016-07-26: 1 via ORAL
  Filled 2016-07-26: qty 1

## 2016-07-26 MED ORDER — CEFAZOLIN SODIUM-DEXTROSE 2-4 GM/100ML-% IV SOLN
INTRAVENOUS | Status: AC
  Filled 2016-07-26: qty 100

## 2016-07-26 SURGICAL SUPPLY — 36 items
BLADE CLIPPER SURG (BLADE) ×2 IMPLANT
BLADE SURG 15 STRL LF DISP TIS (BLADE) ×1 IMPLANT
BLADE SURG 15 STRL SS (BLADE) ×3
CLOTH BEACON ORANGE TIMEOUT ST (SAFETY) ×3 IMPLANT
COVER BACK TABLE 60X90IN (DRAPES) ×3 IMPLANT
COVER MAYO STAND STRL (DRAPES) ×3 IMPLANT
DRAIN PENROSE 18X1/4 LTX STRL (WOUND CARE) ×3 IMPLANT
DRAPE LAPAROTOMY 100X72 PEDS (DRAPES) ×3 IMPLANT
ELECT NDL TIP 2.8 STRL (NEEDLE) IMPLANT
ELECT NEEDLE TIP 2.8 STRL (NEEDLE) ×3 IMPLANT
ELECT REM PT RETURN 9FT ADLT (ELECTROSURGICAL) ×3
ELECTRODE REM PT RTRN 9FT ADLT (ELECTROSURGICAL) ×1 IMPLANT
GLOVE BIO SURGEON STRL SZ8 (GLOVE) ×3 IMPLANT
GOWN STRL REUS W/TWL LRG LVL3 (GOWN DISPOSABLE) ×3 IMPLANT
KIT ROOM TURNOVER WOR (KITS) ×3 IMPLANT
LIQUID BAND (GAUZE/BANDAGES/DRESSINGS) ×2 IMPLANT
LOOP VESSEL MAXI BLUE (MISCELLANEOUS) ×3 IMPLANT
NEEDLE HYPO 25X1 1.5 SAFETY (NEEDLE) ×3 IMPLANT
NS IRRIG 500ML POUR BTL (IV SOLUTION) ×2 IMPLANT
PACK BASIN DAY SURGERY FS (CUSTOM PROCEDURE TRAY) ×3 IMPLANT
PENCIL BUTTON HOLSTER BLD 10FT (ELECTRODE) ×3 IMPLANT
SUT CHROMIC 3 0 PS 2 (SUTURE) ×3 IMPLANT
SUT MNCRL AB 4-0 PS2 18 (SUTURE) ×2 IMPLANT
SUT SILK 2 0 (SUTURE) ×3
SUT SILK 2-0 18XBRD TIE 12 (SUTURE) ×1 IMPLANT
SUT SILK 3 0 (SUTURE)
SUT SILK 3-0 18XBRD TIE 12 (SUTURE) IMPLANT
SUT SILK 4 0 TIES 17X18 (SUTURE) IMPLANT
SUT VIC AB 3-0 SH 27 (SUTURE) ×3
SUT VIC AB 3-0 SH 27X BRD (SUTURE) IMPLANT
SYR CONTROL 10ML LL (SYRINGE) ×3 IMPLANT
TOWEL OR 17X24 6PK STRL BLUE (TOWEL DISPOSABLE) ×5 IMPLANT
TRAY DSU PREP LF (CUSTOM PROCEDURE TRAY) ×3 IMPLANT
TUBE CONNECTING 12'X1/4 (SUCTIONS) ×1
TUBE CONNECTING 12X1/4 (SUCTIONS) ×1 IMPLANT
YANKAUER SUCT BULB TIP NO VENT (SUCTIONS) ×2 IMPLANT

## 2016-07-26 NOTE — Anesthesia Preprocedure Evaluation (Addendum)
Anesthesia Evaluation  Patient identified by MRN, date of birth, ID band Patient awake    Reviewed: Allergy & Precautions, NPO status , Patient's Chart, lab work & pertinent test results  Airway Mallampati: II  TM Distance: >3 FB Neck ROM: full    Dental  (+) Edentulous Upper, Edentulous Lower   Pulmonary Current Smoker,    breath sounds clear to auscultation       Cardiovascular negative cardio ROS   Rhythm:regular Rate:Normal     Neuro/Psych  Headaches, Bipolar Disorder Schizophrenia    GI/Hepatic GERD  ,  Endo/Other    Renal/GU      Musculoskeletal   Abdominal   Peds  Hematology   Anesthesia Other Findings   Reproductive/Obstetrics                            Anesthesia Physical  Anesthesia Plan  ASA: II  Anesthesia Plan: General   Post-op Pain Management:    Induction: Intravenous  Airway Management Planned: LMA  Additional Equipment:   Intra-op Plan:   Post-operative Plan:   Informed Consent: I have reviewed the patients History and Physical, chart, labs and discussed the procedure including the risks, benefits and alternatives for the proposed anesthesia with the patient or authorized representative who has indicated his/her understanding and acceptance.     Plan Discussed with: Anesthesiologist, CRNA and Surgeon  Anesthesia Plan Comments:         Anesthesia Quick Evaluation

## 2016-07-26 NOTE — Anesthesia Procedure Notes (Signed)
Procedure Name: LMA Insertion Date/Time: 07/26/2016 2:12 PM Performed by: Renella CunasHAZEL, Damyia Strider D Pre-anesthesia Checklist: Patient identified, Emergency Drugs available, Suction available and Patient being monitored Patient Re-evaluated:Patient Re-evaluated prior to inductionOxygen Delivery Method: Circle system utilized Preoxygenation: Pre-oxygenation with 100% oxygen Intubation Type: IV induction Ventilation: Mask ventilation without difficulty LMA: LMA inserted LMA Size: 4.0 Number of attempts: 1 Airway Equipment and Method: Bite block Placement Confirmation: positive ETCO2 Tube secured with: Tape Dental Injury: Teeth and Oropharynx as per pre-operative assessment

## 2016-07-26 NOTE — Anesthesia Postprocedure Evaluation (Signed)
Anesthesia Post Note  Patient: Steven Quinn  Procedure(s) Performed: Procedure(s) (LRB): LEFT NEUROLYSIS VARICOCELECTOMY (Left)  Patient location during evaluation: PACU Anesthesia Type: General Level of consciousness: awake Pain management: pain level controlled Vital Signs Assessment: post-procedure vital signs reviewed and stable Respiratory status: spontaneous breathing Cardiovascular status: stable Anesthetic complications: no    Last Vitals:  Vitals:   07/26/16 1615 07/26/16 1618  BP: (!) 117/93 116/78  Pulse: 66 64  Resp: 16 13  Temp:      Last Pain:  Vitals:   07/26/16 1625  TempSrc:   PainSc: 4                  EDWARDS,Celenia Hruska

## 2016-07-26 NOTE — Transfer of Care (Signed)
Immediate Anesthesia Transfer of Care Note  Patient: Steven Quinn  Procedure(s) Performed: Procedure(s) (LRB): LEFT NEUROLYSIS VARICOCELECTOMY (Left)  Patient Location: PACU  Anesthesia Type: General  Level of Consciousness: awake, oriented, sedated and patient cooperative  Airway & Oxygen Therapy: Patient Spontanous Breathing and Patient connected to face mask oxygen  Post-op Assessment: Report given to PACU RN and Post -op Vital signs reviewed and stable  Post vital signs: Reviewed and stable  Complications: No apparent anesthesia complications Last Vitals:  Vitals:   07/26/16 1221 07/26/16 1540  BP: (!) 114/56 111/78  Pulse: 64   Resp: 16   Temp: 36.6 C 36.4 C

## 2016-07-26 NOTE — Brief Op Note (Signed)
07/26/2016  3:31 PM  PATIENT:  Steven GumsAndrew T Quinn  30 y.o. male  PRE-OPERATIVE DIAGNOSIS:  LEFT TESTICULAR PAIN  POST-OPERATIVE DIAGNOSIS:  LEFT TESTICULAR PAIN  PROCEDURE:  Procedure(s) with comments: LEFT NEUROLYSIS VARICOCELECTOMY (Left) - 90 MINS  SURGEON:  Surgeon(s) and Role:    * Malen GauzePatrick L Elia Nunley, MD - Primary  PHYSICIAN ASSISTANT:   ASSISTANTS: none   ANESTHESIA:   local and general  EBL:  Total I/O In: 1000 [I.V.:1000] Out: -   BLOOD ADMINISTERED:none  DRAINS: none   LOCAL MEDICATIONS USED:  MARCAINE     SPECIMEN:  No Specimen  DISPOSITION OF SPECIMEN:  N/A  COUNTS:  YES  TOURNIQUET:  * No tourniquets in log *  DICTATION: .Note written in EPIC  PLAN OF CARE: Discharge to home after PACU  PATIENT DISPOSITION:  PACU - hemodynamically stable.   Delay start of Pharmacological VTE agent (>24hrs) due to surgical blood loss or risk of bleeding: not applicable

## 2016-07-26 NOTE — H&P (Signed)
Urology Admission H&P  Chief Complaint: left testicular pain  History of Present Illness: [name] is a 30yo with a hx of left inguinal lymphadenopathy s/p resection who then developed chronic left testicular pain. He was initially managed with narcotics. He then had a durable response with cord block for over 2 weeks. Currently he had mild constant dull nonradiating left testicular pain  Past Medical History:  Diagnosis Date  . Bipolar 1 disorder (HCC)    no current med.  Marland Kitchen. GERD (gastroesophageal reflux disease)    TUMS as needed  . History of gastric ulcer   . History of lymphadenopathy    bilateral inguinal s/p  bx left side -- per pt negative  path  . Left testicular pain   . Manic depression (HCC)   . Migraines   . Schizophrenia (HCC)    no current med.  . Urinary hesitancy   . Urinary urgency   . Wears dentures    upper  . Wears partial dentures    lower   Past Surgical History:  Procedure Laterality Date  . APPENDECTOMY  age 30  . INGUINAL LYMPH NODE BIOPSY Left 02/04/2016   Procedure: LEFT INGUINAL LYMPH NODE BIOPSY;  Surgeon: Manus RuddMatthew Tsuei, MD;  Location: [facility];  Service: General;  Laterality: Left;    Home Medications:  Prescriptions Prior to Admission  Medication Sig Dispense Refill Last Dose  . diazepam (VALIUM) 5 MG tablet Take 5 mg by mouth at bedtime.   07/25/2016 at Unknown time  . oxyCODONE-acetaminophen (PERCOCET/ROXICET) 5-325 MG tablet Take by mouth every 4 (four) hours as needed for severe pain.   Past Week at Unknown time  . calcium carbonate (TUMS - DOSED IN MG ELEMENTAL CALCIUM) 500 MG chewable tablet Chew 1 tablet by mouth as needed for indigestion or heartburn.   More than a month at Unknown time   Allergies:  Allergies  Allergen Reactions  . Darvon [Propoxyphene] Nausea And Vomiting  . Tramadol Other (See Comments)    headache    Family History  Problem Relation Age of Onset  . Fibromyalgia Mother   . Diabetes Mother     Social History:  reports that he has been smoking Cigarettes.  He has a 24.00 pack-year smoking history. He has never used smokeless tobacco. He reports that he drinks alcohol. He reports that he does not use drugs.  Review of Systems  All other systems reviewed and are negative.   Physical Exam:  Vital signs in last 24 hours: Temp:  [97.9 F (36.6 C)] 97.9 F (36.6 C) (08/14 1221) Pulse Rate:  [64] 64 (08/14 1221) Resp:  [16] 16 (08/14 1221) BP: (114)/(56) 114/56 (08/14 1221) SpO2:  [100 %] 100 % (08/14 1221) Weight:  [72.8 kg (160 lb 8 oz)] 72.8 kg (160 lb 8 oz) (08/14 1221) Physical Exam  Constitutional: He is oriented to person, place, and time. He appears well-developed and well-nourished.  HENT:  Head: Normocephalic and atraumatic.  Eyes: EOM are normal. Pupils are equal, round, and reactive to light.  Neck: Normal range of motion. No thyromegaly present.  Cardiovascular: Normal rate and regular rhythm.   Respiratory: Effort normal. No respiratory distress.  GI: Soft. He exhibits no distension.  Musculoskeletal: Normal range of motion. He exhibits no edema.  Neurological: He is alert and oriented to person, place, and time.  Skin: Skin is warm and dry.  Psychiatric: He has a normal mood and affect. His behavior is normal. Judgment and thought content  normal.    Laboratory Data:  Results for orders placed or performed during the hospital encounter of 07/26/16 (from the past 24 hour(s))  Hemoglobin-hemacue, POC     Status: None   Collection Time: 07/26/16 12:55 PM  Result Value Ref Range   Hemoglobin 15.3 13.0 - 17.0 g/dL   No results found for this or any previous visit (from the past 240 hour(s)). Creatinine: No results for input(s): CREATININE in the last 168 hours. Baseline Creatinine: unknown  Impression/Assessment:  30yo with left chronic orchalgia  Plan:  The risks/beenfits/alternatives to left neurolysis was explained to the patient and he understands  and wishes to proceed with surgery  Wilkie AyePatrick Ala Kratz 07/26/2016, 2:00 PM

## 2016-07-26 NOTE — Discharge Instructions (Addendum)
Varicocele A varicocele is a swelling of veins in the scrotum. The scrotum is the sac that contains the testicles. Varicoceles can occur on either side of the scrotum, but they are more common on the left side. They occur most often in teenage boys and young men. In most cases, varicoceles are not a serious problem. They are usually small and painless and do not require treatment. Tests may be done to confirm the diagnosis. Treatment may be needed if:  A varicocele is large, causes a lot of pain, or causes pain when exercising.  Varicoceles are found on both sides of the scrotum.  The testicle on the opposite side is absent or not normal.  A varicocele causes a decrease in the size of the testicle in a growing adolescent.  The person has fertility problems. CAUSES This condition is the result of valves in the veins not working properly. Valves in the veins help to return blood from the scrotum and testicles to the heart. If these valves do not work well, blood flows backward and backs up into the veins, which causes the veins to swell. This is similar to what happens when varicose veins form in the leg. SYMPTOMS Most varicoceles do not cause any symptoms. If symptoms do occur, they may include:  Swelling on one side of the scrotum. The swelling may be more obvious when you are standing up.  A lumpy feeling in the scrotum.  A heavy feeling on one side of the scrotum.  A dull ache in the scrotum, especially after exercise or prolonged standing or sitting.  Slower growth or reduced size of the testicle on the side of the varicocele (in young males).  Problems with fertility. These can occur if the testicle does not grow normally. DIAGNOSIS This condition may be diagnosed with a physical exam. You may also have an imaging test, called an ultrasound, to confirm the diagnosis and to help rule out other causes of the swelling. TREATMENT Treatment is usually not needed for this condition. If  you have any pain, your health care provider may prescribe or recommend medicine to help relieve it. You may need regular exams so your health care provider can monitor the varicocele to ensure that it does not cause problems. When further treatment is needed, it may involve one of these options:  Varicocelectomy. This is a surgery in which the swollen veins are tied off so that the flow of blood goes to other veins instead.  Embolization. In this procedure, a small tube (catheter) is used to place metal coils or other blocking items in the veins. This cuts off the blood flow to the swollen veins. HOME CARE INSTRUCTIONS  Take medicines only as directed by your health care provider.  Wear supportive underwear.  Use an athletic supporter for sports.  Keep all follow-up visits as directed by your health care provider. This is important. SEEK MEDICAL CARE IF:  Your pain is increasing.  You have redness in the affected area.  You have swelling that does not decrease when you are lying down.  One of your testicles is smaller than the other.  Your testicle becomes enlarged, swollen, or painful.   This information is not intended to replace advice given to you by your health care provider. Make sure you discuss any questions you have with your health care provider.   Call your surgeon if you experience:   1.  Fever over 101.0. 2.  Inability to urinate. 3.  Nausea and/or vomiting.  4.  Extreme swelling or bruising at the surgical site. 5.  Continued bleeding from the incision. 6.  Increased pain, redness or drainage from the incision. 7.  Problems related to your pain medication. 8.  Any problems and/or concerns  Post Anesthesia Home Care Instructions  Activity: Get plenty of rest for the remainder of the day. A responsible adult should stay with you for 24 hours following the procedure.  For the next 24 hours, DO NOT: -Drive a car -Advertising copywriterperate machinery -Drink alcoholic  beverages -Take any medication unless instructed by your physician -Make any legal decisions or sign important papers.  Meals: Start with liquid foods such as gelatin or soup. Progress to regular foods as tolerated. Avoid greasy, spicy, heavy foods. If nausea and/or vomiting occur, drink only clear liquids until the nausea and/or vomiting subsides. Call your physician if vomiting continues.  Special Instructions/Symptoms: Your throat may feel dry or sore from the anesthesia or the breathing tube placed in your throat during surgery. If this causes discomfort, gargle with warm salt water. The discomfort should disappear within 24 hours.  If you had a scopolamine patch placed behind your ear for the management of post- operative nausea and/or vomiting:  1. The medication in the patch is effective for 72 hours, after which it should be removed.  Wrap patch in a tissue and discard in the trash. Wash hands thoroughly with soap and water. 2. You may remove the patch earlier than 72 hours if you experience unpleasant side effects which may include dry mouth, dizziness or visual disturbances. 3. Avoid touching the patch. Wash your hands with soap and water after contact with the patch.

## 2016-07-27 ENCOUNTER — Encounter (HOSPITAL_BASED_OUTPATIENT_CLINIC_OR_DEPARTMENT_OTHER): Payer: Self-pay | Admitting: Urology

## 2016-07-28 ENCOUNTER — Encounter: Payer: Self-pay | Admitting: Family Medicine

## 2016-07-29 NOTE — Op Note (Signed)
Preoperative diagnosis: left varicocele, chronic orchalgia  Postoperative diagnosis: Same  Procedure: Left varicocelectomy Left Neurolysis  Attending: Wilkie AyePatrick Mckenzie, MD  Anesthesia: General  History of blood loss: 5cc  Antibiotics: ancef  Drains: none  Specimens: none  Findings: numerous tortuous veins in spermatic cord. Multiple spermatic cord nerve ligated. Testicular artery identified and preserved using doppler.  Indications: Patient is a 30 year old male with a history of left testicular pain who was found tp have a varicocele and has chronic orchalgia after inguinal node dissection.  After discussing treatment options he decided to proceed with varicocelectomy and neurolysis.   Procedure in detail: Prior to procedure consent was obtained.  Patient was brought to the operating room and a brief timeout was done to ensure correct patient, correct procedure, correct site.  General anesthesia was administered and patient was placed in supine position.  His genitalia and abdomen was then prepped and draped in usual sterile fashion.  A 4 cm incision was made in the subinguinal region.  We dissected down through the subcutaneous tissue to until we reached the spermatic cord.  The spermatic cord was identified and a Penrose drain was placed under the cord.  We then opened the cord with sharp dissection. We proceeded to identify the testicular artery with the doppler. Once the artery was identified and loop was placed around it and it was excluded from the remainder of the spermatic cord. We then proceeded to ligate the veins in the cord with 3-0 silk ties. We also ligated the vas deferens with 0 silk tie. Once all the veins were ligated we once again check for flow in the testicular artery and we noted good flow. We then inspected the operative field and no residual bleeding was noted. We then closed the subcutaneous tissues in 2 layers with 2-0 Vicryl in a running fashion.  We then closed the  skin with 4-0 Monocryl in a running fashion.  Skin glue was then placed over the incision. We then placed a scrotal fluff and this then concluded the procedure which was well tolerated by the patient.  Complications: None  Condition: Stable, extubated, transferred to PACU.  Plan: Patient is to be discharged home.  He is to follow up in 2 weeks for wound check.

## 2016-07-31 ENCOUNTER — Emergency Department (HOSPITAL_COMMUNITY)
Admission: EM | Admit: 2016-07-31 | Discharge: 2016-07-31 | Disposition: A | Discharge status: Home / Self Care | Payer: Medicare Other | Attending: Emergency Medicine | Admitting: Emergency Medicine

## 2016-07-31 ENCOUNTER — Encounter (HOSPITAL_COMMUNITY): Payer: Self-pay | Admitting: *Deleted

## 2016-07-31 DIAGNOSIS — Z79899 Other long term (current) drug therapy: Secondary | ICD-10-CM | POA: Insufficient documentation

## 2016-07-31 DIAGNOSIS — G8918 Other acute postprocedural pain: Secondary | ICD-10-CM | POA: Diagnosis not present

## 2016-07-31 DIAGNOSIS — R1110 Vomiting, unspecified: Secondary | ICD-10-CM | POA: Diagnosis not present

## 2016-07-31 DIAGNOSIS — F17210 Nicotine dependence, cigarettes, uncomplicated: Secondary | ICD-10-CM | POA: Insufficient documentation

## 2016-07-31 DIAGNOSIS — N50812 Left testicular pain: Secondary | ICD-10-CM | POA: Insufficient documentation

## 2016-07-31 MED ORDER — IBUPROFEN 800 MG PO TABS
800.0000 mg | ORAL_TABLET | Freq: Once | ORAL | Status: AC
  Administered 2016-07-31: 800 mg via ORAL
  Filled 2016-07-31: qty 1

## 2016-07-31 NOTE — ED Triage Notes (Signed)
Pt reports having surgery on testicles due to a  Varicocele. Pt reports increased swelling to testicles. Pt reports incision area is red and has a small amount of bleeding to the area. Pt also reports testicles are bluish, black in color.

## 2016-07-31 NOTE — Discharge Instructions (Signed)
Use ibuprofen in addition to narcotic pain medicine. Keep elevated with cold packs. Call urologist tomorrow for recheck asap Return if redness or fever.

## 2016-07-31 NOTE — ED Provider Notes (Signed)
[facility]-EMERGENCY DEPT Provider Note   CSN: [id number] Arrival date & time: 07/31/16  1851  By signing my name below, I, Linna DarnerRussell Turner, attest that this documentation has been prepared under the direction and in the presence of physician practitioner, Margarita Grizzleanielle Lida Berkery, MD. Electronically Signed: Linna Darnerussell Turner, Scribe. 07/31/2016. 8:21 PM.   History   Chief Complaint Chief Complaint  Patient presents with  . Post-op Problem    The history is provided by the patient. No language interpreter was used.     HPI Comments: Steven Quinn is a 30 y.o. male who presents to the Emergency Department complaining of sudden onset, constant, stabbing, testicular pain beginning 3 days ago. He endorses associated swelling. Pt reports he had testicular surgery 5 days ago because of a varicocele. He states he called the urologist today and was advised to come to the ER. Pt also reports "leakage" from the incision site. Pt has applied ice to his testicles and has been elevating his legs with no relief. Pt has been taking oxycodone 10 mg since surgery and was on oxycodone 5 mg before the surgery due to the same symptoms. He notes he vomited 2 days ago. He states he has been eating and drinking normally lately. Pt denies wearing compression underwear or h/o blood clot. He has not used NSAIDs since his surgery. Pt denies fever, chills, or any other associated symptoms. Pt notes he has a follow-up appointment with urology in 11 days.  Past Medical History:  Diagnosis Date  . Bipolar 1 disorder (HCC)    no current med.  Marland Kitchen. GERD (gastroesophageal reflux disease)    TUMS as needed  . History of gastric ulcer   . History of lymphadenopathy    bilateral inguinal s/p  bx left side -- per pt negative  path  . Left testicular pain   . Manic depression (HCC)   . Migraines   . Schizophrenia (HCC)    no current med.  . Urinary hesitancy   . Urinary urgency   . Wears dentures    upper  . Wears partial dentures    lower     Patient Active Problem List   Diagnosis Date Noted  . Night sweats 03/02/2016  . Testicular pain 02/27/2016  . Mood disorder (HCC) 12/19/2015  . Inguinal lymphadenopathy 12/19/2015  . Temporomandibular joint pain 12/19/2015    Past Surgical History:  Procedure Laterality Date  . APPENDECTOMY  age 30  . INGUINAL LYMPH NODE BIOPSY Left 02/04/2016   Procedure: LEFT INGUINAL LYMPH NODE BIOPSY;  Surgeon: Manus RuddMatthew Tsuei, MD;  Location: [facility];  Service: General;  Laterality: Left;  Marland Kitchen. VARICOCELECTOMY Left 07/26/2016   Procedure: LEFT NEUROLYSIS VARICOCELECTOMY;  Surgeon: Malen GauzePatrick L McKenzie, MD;  [facility];  Service: Urology;  Laterality: Left;  90 MINS       Home Medications    Prior to Admission medications   Medication Sig Start Date End Date Taking? Authorizing Provider  calcium carbonate (TUMS - DOSED IN MG ELEMENTAL CALCIUM) 500 MG chewable tablet Chew 1 tablet by mouth as needed for indigestion or heartburn.   Yes Historical Provider, MD  diazepam (VALIUM) 5 MG tablet Take 5 mg by mouth at bedtime.   Yes Historical Provider, MD  oxyCODONE-acetaminophen (PERCOCET) 10-325 MG tablet Take 1 tablet by mouth every 4 (four) hours as needed for pain. 07/26/16  Yes Malen GauzePatrick L McKenzie, MD    Family History Family History  Problem Relation Age of Onset  .  Fibromyalgia Mother   . Diabetes Mother     Social History Social History  Substance Use Topics  . Smoking status: Current Every Day Smoker    Packs/day: 1.50    Years: 16.00    Types: Cigarettes  . Smokeless tobacco: Never Used  . Alcohol use Yes     Comment: occasionally     Allergies   Darvon [propoxyphene] and Tramadol   Review of Systems Review of Systems  Constitutional: Negative for chills and fever.  Gastrointestinal: Positive for vomiting (one episode).  Genitourinary: Positive for scrotal swelling and testicular pain.  All other systems reviewed and are  negative.   Physical Exam Updated Vital Signs BP (!) 101/46 (BP Location: Left Arm)   Pulse 83   Temp 98.7 F (37.1 C) (Oral)   Resp 14   Ht 6\' 1"  (1.854 m)   Wt 160 lb (72.6 kg)   SpO2 97%   BMI 21.11 kg/m   Physical Exam  Constitutional: He appears well-developed and well-nourished.  HENT:  Head: Normocephalic.  Eyes: Pupils are equal, round, and reactive to light.  Neck: Normal range of motion.  Cardiovascular: Normal rate.   Pulmonary/Chest: Effort normal.  Abdominal: Soft. Bowel sounds are normal.  Genitourinary:  Genitourinary Comments: Well healing left inguinal incision, no pus or erythema Left testicle nontender, some bruising noted scrotum and left thigj  Musculoskeletal: Normal range of motion.  Neurological: He is alert. He has normal reflexes.  Psychiatric: He has a normal mood and affect.  Nursing note and vitals reviewed.    ED Treatments / Results  Labs (all labs ordered are listed, but only abnormal results are displayed) Labs Reviewed - No data to display  EKG  EKG Interpretation None       Radiology No results found.  Procedures Procedures (including critical care time)  DIAGNOSTIC STUDIES: Oxygen Saturation is 97% on RA, normal by my interpretation.    COORDINATION OF CARE: 8:21 PM Discussed treatment plan with pt at bedside and pt agreed to plan.  Medications Ordered in ED Medications - No data to display   Initial Impression / Assessment and Plan / ED Course  I have reviewed the triage vital signs and the nursing notes.  Pertinent labs & imaging results that were available during my care of the patient were reviewed by me and considered in my medical decision making (see chart for details).  Clinical Course    I personally performed the services described in this documentation, which was scribed in my presence. The recorded information has been reviewed and considered.  Final Clinical Impressions(s) / ED Diagnoses    Final diagnoses:  Post-operative pain    New Prescriptions New Prescriptions   No medications on file     Margarita Grizzleanielle Joell Usman, MD 08/05/16 1251

## 2016-08-04 ENCOUNTER — Ambulatory Visit (INDEPENDENT_AMBULATORY_CARE_PROVIDER_SITE_OTHER): Payer: Self-pay | Admitting: Urology

## 2016-08-04 ENCOUNTER — Ambulatory Visit: Payer: Self-pay | Admitting: Urology

## 2016-08-04 DIAGNOSIS — Z9889 Other specified postprocedural states: Secondary | ICD-10-CM

## 2016-09-08 ENCOUNTER — Ambulatory Visit (INDEPENDENT_AMBULATORY_CARE_PROVIDER_SITE_OTHER): Payer: Self-pay | Admitting: Urology

## 2016-09-08 DIAGNOSIS — Z9889 Other specified postprocedural states: Secondary | ICD-10-CM

## 2016-10-13 ENCOUNTER — Ambulatory Visit (INDEPENDENT_AMBULATORY_CARE_PROVIDER_SITE_OTHER): Payer: Self-pay | Admitting: Urology

## 2016-10-13 DIAGNOSIS — Z9889 Other specified postprocedural states: Secondary | ICD-10-CM

## 2016-11-17 ENCOUNTER — Ambulatory Visit (INDEPENDENT_AMBULATORY_CARE_PROVIDER_SITE_OTHER): Payer: Medicare Other | Admitting: Urology

## 2016-11-17 DIAGNOSIS — R102 Pelvic and perineal pain: Secondary | ICD-10-CM | POA: Diagnosis not present

## 2016-12-29 ENCOUNTER — Ambulatory Visit: Payer: Medicare Other | Admitting: Urology

## 2017-01-12 ENCOUNTER — Ambulatory Visit (INDEPENDENT_AMBULATORY_CARE_PROVIDER_SITE_OTHER): Payer: Medicare Other | Admitting: Urology

## 2017-01-12 DIAGNOSIS — R102 Pelvic and perineal pain: Secondary | ICD-10-CM | POA: Diagnosis not present

## 2017-01-12 DIAGNOSIS — R360 Urethral discharge without blood: Secondary | ICD-10-CM | POA: Diagnosis not present

## 2017-01-14 ENCOUNTER — Ambulatory Visit (INDEPENDENT_AMBULATORY_CARE_PROVIDER_SITE_OTHER): Payer: Medicare Other

## 2017-01-14 ENCOUNTER — Ambulatory Visit (INDEPENDENT_AMBULATORY_CARE_PROVIDER_SITE_OTHER): Payer: Medicare Other | Admitting: Family Medicine

## 2017-01-14 ENCOUNTER — Encounter: Payer: Self-pay | Admitting: Family Medicine

## 2017-01-14 VITALS — BP 127/79 | HR 85 | Temp 97.5°F | Ht 73.0 in | Wt 164.4 lb

## 2017-01-14 DIAGNOSIS — G8929 Other chronic pain: Secondary | ICD-10-CM

## 2017-01-14 DIAGNOSIS — M5441 Lumbago with sciatica, right side: Secondary | ICD-10-CM

## 2017-01-14 DIAGNOSIS — M5442 Lumbago with sciatica, left side: Secondary | ICD-10-CM | POA: Diagnosis not present

## 2017-01-14 DIAGNOSIS — R102 Pelvic and perineal pain: Principal | ICD-10-CM

## 2017-01-14 DIAGNOSIS — M549 Dorsalgia, unspecified: Secondary | ICD-10-CM

## 2017-01-14 NOTE — Patient Instructions (Signed)
Great to see you!  Come back in 4-6 weeks to see how it is going.   We will call with x ray results within 1 week
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
## 2017-01-18 NOTE — Addendum Note (Signed)
Addended by: Elenora GammaBRADSHAW, SAMUEL L on: 01/18/2017 05:42 PM   Modules accepted: Orders

## 2017-04-06 ENCOUNTER — Ambulatory Visit: Payer: Medicare Other | Admitting: Urology

## 2017-10-21 ENCOUNTER — Ambulatory Visit: Payer: Medicare Other | Admitting: Family Medicine

## 2017-10-24 ENCOUNTER — Encounter: Payer: Self-pay | Admitting: Family Medicine

## 2017-12-11 DIAGNOSIS — W19XXXA Unspecified fall, initial encounter: Secondary | ICD-10-CM | POA: Diagnosis not present

## 2017-12-11 DIAGNOSIS — N50812 Left testicular pain: Secondary | ICD-10-CM | POA: Diagnosis not present

## 2017-12-11 DIAGNOSIS — M25552 Pain in left hip: Secondary | ICD-10-CM | POA: Diagnosis not present

## 2017-12-11 DIAGNOSIS — F17200 Nicotine dependence, unspecified, uncomplicated: Secondary | ICD-10-CM | POA: Diagnosis not present

## 2017-12-11 DIAGNOSIS — M25551 Pain in right hip: Secondary | ICD-10-CM | POA: Diagnosis not present

## 2017-12-11 DIAGNOSIS — Z79899 Other long term (current) drug therapy: Secondary | ICD-10-CM | POA: Diagnosis not present

## 2017-12-11 DIAGNOSIS — N50811 Right testicular pain: Secondary | ICD-10-CM | POA: Diagnosis not present

## 2017-12-11 DIAGNOSIS — R55 Syncope and collapse: Secondary | ICD-10-CM | POA: Diagnosis not present

## 2017-12-11 DIAGNOSIS — S0083XA Contusion of other part of head, initial encounter: Secondary | ICD-10-CM | POA: Diagnosis not present

## 2017-12-11 DIAGNOSIS — R51 Headache: Secondary | ICD-10-CM | POA: Diagnosis not present

## 2017-12-11 DIAGNOSIS — F319 Bipolar disorder, unspecified: Secondary | ICD-10-CM | POA: Diagnosis not present

## 2017-12-11 DIAGNOSIS — S7002XA Contusion of left hip, initial encounter: Secondary | ICD-10-CM | POA: Diagnosis not present

## 2019-03-08 ENCOUNTER — Other Ambulatory Visit: Payer: Self-pay | Admitting: Family

## 2019-03-08 ENCOUNTER — Other Ambulatory Visit: Payer: Self-pay | Admitting: Internal Medicine

## 2019-03-08 ENCOUNTER — Other Ambulatory Visit (HOSPITAL_COMMUNITY): Payer: Self-pay | Admitting: Family

## 2019-03-08 DIAGNOSIS — N50811 Right testicular pain: Secondary | ICD-10-CM

## 2019-03-08 DIAGNOSIS — R3912 Poor urinary stream: Secondary | ICD-10-CM

## 2019-04-04 ENCOUNTER — Ambulatory Visit: Payer: Self-pay | Admitting: Urology

## 2019-04-09 ENCOUNTER — Ambulatory Visit (HOSPITAL_COMMUNITY): Admission: RE | Admit: 2019-04-09 | Payer: Medicare Other | Source: Ambulatory Visit

## 2019-04-09 ENCOUNTER — Encounter (HOSPITAL_COMMUNITY): Payer: Self-pay

## 2019-05-16 ENCOUNTER — Other Ambulatory Visit: Payer: Self-pay | Admitting: Family

## 2019-05-16 ENCOUNTER — Other Ambulatory Visit (HOSPITAL_COMMUNITY): Payer: Self-pay | Admitting: Family

## 2019-05-16 DIAGNOSIS — R3912 Poor urinary stream: Secondary | ICD-10-CM

## 2019-05-16 DIAGNOSIS — N50811 Right testicular pain: Secondary | ICD-10-CM

## 2019-10-24 ENCOUNTER — Other Ambulatory Visit (HOSPITAL_COMMUNITY): Payer: Self-pay | Admitting: Family

## 2019-10-24 ENCOUNTER — Other Ambulatory Visit: Payer: Self-pay | Admitting: Family

## 2019-10-24 DIAGNOSIS — R3912 Poor urinary stream: Secondary | ICD-10-CM

## 2019-10-24 DIAGNOSIS — N50811 Right testicular pain: Secondary | ICD-10-CM

## 2019-10-31 ENCOUNTER — Other Ambulatory Visit: Payer: Self-pay

## 2019-10-31 ENCOUNTER — Ambulatory Visit (HOSPITAL_COMMUNITY)
Admission: RE | Admit: 2019-10-31 | Discharge: 2019-10-31 | Disposition: A | Discharge status: Home / Self Care | Payer: Medicare Other | Source: Ambulatory Visit | Attending: Family | Admitting: Family

## 2019-10-31 DIAGNOSIS — N50811 Right testicular pain: Secondary | ICD-10-CM | POA: Diagnosis present

## 2019-10-31 DIAGNOSIS — R3912 Poor urinary stream: Secondary | ICD-10-CM | POA: Diagnosis present

## 2019-12-26 ENCOUNTER — Other Ambulatory Visit: Payer: Self-pay

## 2019-12-26 ENCOUNTER — Encounter: Payer: Self-pay | Admitting: Urology

## 2019-12-26 ENCOUNTER — Ambulatory Visit (INDEPENDENT_AMBULATORY_CARE_PROVIDER_SITE_OTHER): Payer: Medicare Other | Admitting: Urology

## 2019-12-26 VITALS — BP 126/85 | HR 100 | Temp 97.9°F | Ht 73.0 in | Wt 155.0 lb

## 2019-12-26 DIAGNOSIS — N50812 Left testicular pain: Secondary | ICD-10-CM | POA: Diagnosis not present

## 2019-12-26 MED ORDER — MELOXICAM 7.5 MG PO TABS: 7.5000 mg | ORAL_TABLET | Freq: Every day | ORAL | 1 refills | Status: DC

## 2019-12-26 MED ORDER — OXYCODONE-ACETAMINOPHEN 5-325 MG PO TABS: 1.0000 | ORAL_TABLET | ORAL | 0 refills | Status: DC | PRN

## 2019-12-26 NOTE — Progress Notes (Signed)
Urological Symptom Review  Patient is experiencing the following symptoms:  None Testicular pain Review of Systems  Gastrointestinal (upper)  : Negative for upper GI symptoms  Gastrointestinal (lower) : Negative for lower GI symptoms  Constitutional : Negative for symptoms  Skin: Negative for skin symptoms  Eyes: Negative for eye symptoms  Ear/Nose/Throat : Negative for Ear/Nose/Throat symptoms  Hematologic/Lymphatic: Negative for Hematologic/Lymphatic symptoms  Cardiovascular : Negative for cardiovascular symptoms  Respiratory : Negative for respiratory symptoms  Endocrine: Negative for endocrine symptoms  Musculoskeletal: Negative for musculoskeletal symptoms  Neurological: Negative for neurological symptoms  Psychologic: Negative for psychiatric symptoms

## 2019-12-26 NOTE — Patient Instructions (Signed)
Scrotal Swelling Scrotal swelling refers to a condition in which the sac of skin that contains the testes (scrotum) is enlarged or swollen. Many things can cause the scrotum to enlarge or swell, including:  Fluid around the testicle (hydrocele).  A weakened area in the muscles around the groin (hernia).  An enlarged vein around the testicle (varicocele).  An injury.  An infection.  Certain medical treatments.  Certain medical conditions, such as congestive heart failure.  A recent genital surgery or procedure.  A twisting of the spermatic cord that cuts off blood supply (testicular torsion).  Testicular cancer. Scrotal swelling can happen along with scrotal pain. Follow these instructions at home:  Until the swelling goes away: ? Rest. The best position to rest in is to lie down. ? Limit activity.  Put ice on the scrotum: ? Put ice in a plastic bag. ? Place a towel between your skin and the bag. ? Leave the ice on for 20 minutes, 2-3 times a day for 1-2 days.  Place a rolled towel under your testicles for support.  Wear loose-fitting clothing or an athletic support cup for comfort.  Take over-the-counter and prescription medicines only as told by your health care provider.  Perform a monthly self-exam of the scrotum and penis. Feel for changes. Ask your health care provider how to perform a monthly self-exam if you are unsure. Contact a health care provider if:  You have a sudden pain that is persistent and does not improve.  You have a heavy feeling or notice fluid in the scrotum.  You have pain or burning while urinating.  You have blood in your urine or semen.  You feel a lump around the testicle.  You notice that one testicle is larger than the other. Keep in mind that a small difference in size is normal.  You have a persistent dull ache or pain in your groin or scrotum. Get help right away if:  The pain does not go away.  The pain becomes  severe.  You have a fever or chills.  You have pain or vomiting that cannot be controlled.  One or both sides of the scrotum are very red and swollen.  There is redness spreading upward from your scrotum to your abdomen or downward from your scrotum to your thighs. Summary  Scrotal swelling refers to a condition in which the sac of skin that contains the testes (scrotum) is enlarged.  Many things can cause the scrotum to swell, including hydrocele, a hernia, and a varicocele.  Limiting activity and icing the scrotum may help reduce swelling and pain.  Contact your health care provider if you develop scrotal pain that is sudden and persistent, or if you have pain while urinating. Do this also if you feel a lump around the testicle or notice blood in your urine or semen.  Get help right away for uncontrolled pain or vomiting, for very red and swollen scrotum, or for fever or chills. This information is not intended to replace advice given to you by your health care provider. Make sure you discuss any questions you have with your health care provider. Document Revised: 11/11/2017 Document Reviewed: 02/14/2017 Elsevier Patient Education  2020 Elsevier Inc.  

## 2019-12-26 NOTE — Progress Notes (Signed)
12/26/2019 4:35 PM   Steven Quinn 1986-09-29 [id number]  Referring provider: Junie Spencer, FNP [address] [place],  [place] [address]  Left orchalgia/left inguinal pain  HPI: Steven Quinn is a 33yo with a hx of left orchalgia here for evaluation of continued left orchalgia. He underwent left varicocelectomy in 2017 with me. He was doing well until the last year when he developed worsening left orchalgia and lower extremity foot numbness and cold feeling when he sits in a car. No other exacerbating events or alleviating events. He underwent scrotal US which showed bilateral grade 1 varicoceles and small bilateral hydroceles. NO LUTS   PMH: Past Medical History:  Diagnosis Date  . Bipolar 1 disorder (HCC)    no current med.  Marland Kitchen GERD (gastroesophageal reflux disease)    TUMS as needed  . History of gastric ulcer   . History of lymphadenopathy    bilateral inguinal s/p  bx left side -- per pt negative  path  . Left testicular pain   . Manic depression (HCC)   . Migraines   . Schizophrenia (HCC)    no current med.  . Urinary hesitancy   . Urinary urgency   . Wears dentures    upper  . Wears partial dentures    lower    Surgical History: Past Surgical History:  Procedure Laterality Date  . APPENDECTOMY  age 60  . INGUINAL LYMPH NODE BIOPSY Left 02/04/2016   Procedure: LEFT INGUINAL LYMPH NODE BIOPSY;  Surgeon: Manus Rudd, MD;  Location: [facility];  Service: General;  Laterality: Left;  Marland Kitchen VARICOCELECTOMY Left 07/26/2016   Procedure: LEFT NEUROLYSIS VARICOCELECTOMY;  Surgeon: Malen Gauze, MD;  Location: [facility];  Service: Urology;  Laterality: Left;  90 MINS    Home Medications:  Allergies as of 12/26/2019      Reactions   Darvon [propoxyphene] Nausea And Vomiting   Tramadol Other (See Comments)   headache      Medication List       Accurate as of December 26, 2019  4:35 PM. If you have any questions, ask your nurse or  doctor.        hydrOXYzine 25 MG tablet Commonly known as: ATARAX/VISTARIL Take 25 mg by mouth every 8 (eight) hours as needed.   meloxicam 15 MG tablet Commonly known as: MOBIC Take 1 tablet by mouth daily.   metroNIDAZOLE 500 MG tablet Commonly known as: FLAGYL Take 500 mg by mouth 3 (three) times daily.   QUEtiapine 50 MG tablet Commonly known as: SEROQUEL Take 50 mg by mouth at bedtime.       Allergies:  Allergies  Allergen Reactions  . Darvon [Propoxyphene] Nausea And Vomiting  . Tramadol Other (See Comments)    headache    Family History: Family History  Problem Relation Age of Onset  . Fibromyalgia Mother   . Diabetes Mother     Social History:  reports that he has been smoking cigarettes. He has a 24.00 pack-year smoking history. He has never used smokeless tobacco. He reports current alcohol use. He reports that he does not use drugs.  ROS:                                        Physical Exam: BP 126/85   Pulse 100   Temp 97.9 F (36.6 C)   Ht  6\' 1"  (1.854 m)   Wt 155 lb (70.3 kg)   BMI 20.45 kg/m   Constitutional:  Alert and oriented, No acute distress. HEENT:  AT, moist mucus membranes.  Trachea midline, no masses. Cardiovascular: No clubbing, cyanosis, or edema. Respiratory: Normal respiratory effort, no increased work of breathing. GI: Abdomen is soft, nontender, nondistended, no abdominal masses GU: No CVA tenderness Lymph: No cervical or inguinal lymphadenopathy. Skin: No rashes, bruises or suspicious lesions. Neurologic: Grossly intact, no focal deficits, moving all 4 extremities. Psychiatric: Normal mood and affect.  Laboratory Data: Lab Results  Component Value Date   WBC 14.1 (H) 02/27/2016   HGB 15.3 07/26/2016   HCT 44.1 02/27/2016   MCV 88 02/27/2016   PLT 202 02/27/2016    Lab Results  Component Value Date   CREATININE 0.76 04/29/2016    No results found for: PSA  No results found for:  TESTOSTERONE  No results found for: HGBA1C  Urinalysis    Component Value Date/Time   COLORURINE YELLOW 02/28/2012 2250   APPEARANCEUR CLEAR 02/28/2012 2250   LABSPEC <1.005 (L) 02/28/2012 2250   PHURINE 6.0 02/28/2012 2250   GLUCOSEU NEGATIVE 02/28/2012 2250   HGBUR NEGATIVE 02/28/2012 2250   BILIRUBINUR neg 12/19/2015 1159   KETONESUR NEGATIVE 02/28/2012 2250   PROTEINUR neg 12/19/2015 1159   PROTEINUR NEGATIVE 02/28/2012 2250   UROBILINOGEN negative 12/19/2015 1159   UROBILINOGEN 0.2 02/28/2012 2250   NITRITE neg 12/19/2015 1159   NITRITE NEGATIVE 02/28/2012 2250   LEUKOCYTESUR Negative 12/19/2015 1159    Lab Results  Component Value Date   MUCUS neg 12/19/2015   BACTERIA neg 12/19/2015    Pertinent Imaging: Scrotal US Results for orders placed in visit on 01/17/14  DG Abd 1 View   Narrative CLINICAL DATA:  Abdominal pain  EXAM: ABDOMEN - 1 VIEW  COMPARISON:  CT, 02/1912  FINDINGS: The bowel gas pattern is normal. No radio-opaque calculi or other significant radiographic abnormality are seen.  IMPRESSION: Negative.   Electronically Signed   By: Lajean Manes M.D.   On: 01/17/2014 13:39    No results found for this or any previous visit. No results found for this or any previous visit. No results found for this or any previous visit. No results found for this or any previous visit. No results found for this or any previous visit. No results found for this or any previous visit. No results found for this or any previous visit.  Assessment & Plan:    1. Left orchalgia -meloxicam 15mg  daily  The patient will need to be evaluated by a spine specialist due to lower extremity numbness with sitting.  No follow-ups on file.  Nicolette Bang, MD  [facility] [place]

## 2019-12-31 ENCOUNTER — Encounter: Payer: Self-pay | Admitting: Urology

## 2020-01-02 MED ORDER — OXYCODONE-ACETAMINOPHEN 10-325 MG PO TABS: 1.0000 | ORAL_TABLET | Freq: Three times a day (TID) | ORAL | 0 refills | Status: DC | PRN

## 2020-01-07 ENCOUNTER — Encounter: Payer: Self-pay | Admitting: Diagnostic Neuroimaging

## 2020-01-07 ENCOUNTER — Other Ambulatory Visit: Payer: Self-pay

## 2020-01-07 ENCOUNTER — Ambulatory Visit (INDEPENDENT_AMBULATORY_CARE_PROVIDER_SITE_OTHER): Payer: Medicare Other | Admitting: Diagnostic Neuroimaging

## 2020-01-07 VITALS — BP 118/76 | HR 79 | Temp 97.6°F | Ht 73.0 in | Wt 169.4 lb

## 2020-01-07 DIAGNOSIS — N50812 Left testicular pain: Secondary | ICD-10-CM | POA: Diagnosis not present

## 2020-01-07 DIAGNOSIS — R200 Anesthesia of skin: Secondary | ICD-10-CM | POA: Diagnosis not present

## 2020-01-07 NOTE — Progress Notes (Signed)
[facility]  PATIENT: Steven Quinn DOB: February 07, 1986  REFERRING CLINICIAN: Junie Spencer, FNP HISTORY FROM: patient  REASON FOR VISIT: new consult    HISTORICAL  CHIEF COMPLAINT:  Chief Complaint  Patient presents with  . Pain    rm 6 New Pt "testicular pain x 5-6 years, went to specilist who burnt a nerve; pain is deep inside, now have a cyst; some times my hip feels like it's going to pop off, have numnbess in toes"    HISTORY OF PRESENT ILLNESS:   34 year old male here for evaluation of testicular pain.  Symptoms started in 2013 with left groin and testicular pain.  He felt enlarged lymph nodes in the left inguinal region.  He was found to have left varicocele.  In 2017 he underwent left varicocelectomy and left neurolysis.  Symptoms slightly improved for a while.  However symptoms returned in 2020.  Now patient also having intermittent numbness in his feet, especially if he is sitting in the car for a long time.  Therefore urologist referred patient here for further evaluation of possible lumbar spine issues.    REVIEW OF SYSTEMS: Full 14 system review of systems performed and negative with exception of: as per HPI.  ALLERGIES: Allergies  Allergen Reactions  . Darvon [Propoxyphene] Nausea And Vomiting  . Tramadol Other (See Comments)    headache    HOME MEDICATIONS: Outpatient Medications Prior to Visit  Medication Sig Dispense Refill  . hydrOXYzine (ATARAX/VISTARIL) 25 MG tablet Take 25 mg by mouth every 8 (eight) hours as needed.    . meloxicam (MOBIC) 7.5 MG tablet Take 1 tablet (7.5 mg total) by mouth daily. 30 tablet 1  . oxyCODONE-acetaminophen (PERCOCET) 10-325 MG tablet Take 1 tablet by mouth every 8 (eight) hours as needed for pain. 30 tablet 0  . QUEtiapine (SEROQUEL) 50 MG tablet Take 50 mg by mouth at bedtime.    . meloxicam (MOBIC) 15 MG tablet Take 1 tablet by mouth daily.    . metroNIDAZOLE (FLAGYL) 500 MG tablet Take 500 mg by  mouth 3 (three) times daily.     No facility-administered medications prior to visit.    PAST MEDICAL HISTORY: Past Medical History:  Diagnosis Date  . Bipolar 1 disorder (HCC)    no current med.  Marland Kitchen GERD (gastroesophageal reflux disease)    TUMS as needed  . History of gastric ulcer   . History of lymphadenopathy    bilateral inguinal s/p  bx left side -- per pt negative  path  . Left testicular pain   . Manic depression (HCC)   . Migraines   . Schizophrenia (HCC)    no current med.  . Urinary hesitancy   . Urinary urgency   . Wears dentures    upper  . Wears partial dentures    lower    PAST SURGICAL HISTORY: Past Surgical History:  Procedure Laterality Date  . APPENDECTOMY  age 4  . INGUINAL LYMPH NODE BIOPSY Left 02/04/2016   Procedure: LEFT INGUINAL LYMPH NODE BIOPSY;  Surgeon: Manus Rudd, MD;  Location: [facility];  Service: General;  Laterality: Left;  Marland Kitchen VARICOCELECTOMY Left 07/26/2016   Procedure: LEFT NEUROLYSIS VARICOCELECTOMY;  Surgeon: Malen Gauze, MD;  Location: [facility];  Service: Urology;  Laterality: Left;  90 MINS    FAMILY HISTORY: Family History  Problem Relation Age of Onset  . Fibromyalgia Mother   . Diabetes Mother   . Other Brother  car accident    SOCIAL HISTORY: Social History   Socioeconomic History  . Marital status: Single    Spouse name: Not on file  . Number of children: 3  . Years of education: Not on file  . Highest education level: Some college, no degree  Occupational History    Comment: disability  Tobacco Use  . Smoking status: Current Every Day Smoker    Packs/day: 3.00    Years: 16.00    Pack years: 48.00    Types: Cigarettes  . Smokeless tobacco: Never Used  Substance and Sexual Activity  . Alcohol use: Yes    Comment: occasionally  . Drug use: No  . Sexual activity: Not on file  Other Topics Concern  . Not on file  Social History Narrative   Lives with long  time sig other, 3 children   Caffeine- all I drink is Mtn Dew, occas tea   Social Determinants of Health   Financial Resource Strain:   . Difficulty of Paying Living Expenses: Not on file  Food Insecurity:   . Worried About Programme researcher, broadcasting/film/video in the Last Year: Not on file  . Ran Out of Food in the Last Year: Not on file  Transportation Needs:   . Lack of Transportation (Medical): Not on file  . Lack of Transportation (Non-Medical): Not on file  Physical Activity:   . Days of Exercise per Week: Not on file  . Minutes of Exercise per Session: Not on file  Stress:   . Feeling of Stress : Not on file  Social Connections:   . Frequency of Communication with Friends and Family: Not on file  . Frequency of Social Gatherings with Friends and Family: Not on file  . Attends Religious Services: Not on file  . Active Member of Clubs or Organizations: Not on file  . Attends Banker Meetings: Not on file  . Marital Status: Not on file  Intimate Partner Violence:   . Fear of Current or Ex-Partner: Not on file  . Emotionally Abused: Not on file  . Physically Abused: Not on file  . Sexually Abused: Not on file     PHYSICAL EXAM  GENERAL EXAM/CONSTITUTIONAL: Vitals:  Vitals:   01/07/20 1417  BP: 118/76  Pulse: 79  Temp: 97.6 F (36.4 C)  Weight: 169 lb 6.4 oz (76.8 kg)  Height: 6\' 1"  (1.854 m)     Body mass index is 22.35 kg/m. Wt Readings from Last 3 Encounters:  01/07/20 169 lb 6.4 oz (76.8 kg)  12/26/19 155 lb (70.3 kg)  01/14/17 164 lb 6.4 oz (74.6 kg)     Patient is in no distress; well developed, nourished and groomed; neck is supple  CARDIOVASCULAR:  Examination of carotid arteries is normal; no carotid bruits  Regular rate and rhythm, no murmurs  Examination of peripheral vascular system by observation and palpation is normal  EYES:  Ophthalmoscopic exam of optic discs and posterior segments is normal; no papilledema or hemorrhages  No exam  data present  MUSCULOSKELETAL:  Gait, strength, tone, movements noted in Neurologic exam below  NEUROLOGIC: MENTAL STATUS:  No flowsheet data found.  awake, alert, oriented to person, place and time  recent and remote memory intact  normal attention and concentration  language fluent, comprehension intact, naming intact  fund of knowledge appropriate  CRANIAL NERVE:   2nd - no papilledema on fundoscopic exam  2nd, 3rd, 4th, 6th - pupils equal and reactive to light, visual fields  full to confrontation, extraocular muscles intact, no nystagmus  5th - facial sensation symmetric  7th - facial strength symmetric  8th - hearing intact  9th - palate elevates symmetrically, uvula midline  11th - shoulder shrug symmetric  12th - tongue protrusion midline  MOTOR:   normal bulk and tone, full strength in the BUE, BLE  SENSORY:   normal and symmetric to light touch, temperature, vibration  COORDINATION:   finger-nose-finger, fine finger movements normal  REFLEXES:   deep tendon reflexes present and symmetric  GAIT/STATION:   narrow based gait     DIAGNOSTIC DATA (LABS, IMAGING, TESTING) - I reviewed patient records, labs, notes, testing and imaging myself where available.  Lab Results  Component Value Date   WBC 14.1 (H) 02/27/2016   HGB 15.3 07/26/2016   HCT 44.1 02/27/2016   MCV 88 02/27/2016   PLT 202 02/27/2016      Component Value Date/Time   NA 140 04/29/2016 1338   K 4.3 04/29/2016 1338   CL 98 04/29/2016 1338   CO2 27 04/29/2016 1338   GLUCOSE 99 04/29/2016 1338   BUN 6 04/29/2016 1338   CREATININE 0.76 04/29/2016 1338   CALCIUM 9.4 04/29/2016 1338   PROT 7.1 04/29/2016 1338   ALBUMIN 4.6 04/29/2016 1338   AST 22 04/29/2016 1338   ALT 21 04/29/2016 1338   ALKPHOS 74 04/29/2016 1338   BILITOT 0.3 04/29/2016 1338   GFRNONAA 122 04/29/2016 1338   GFRAA 141 04/29/2016 1338   No results found for: CHOL, HDL, LDLCALC, LDLDIRECT, TRIG,  CHOLHDL No results found for: HGBA1C No results found for: VITAMINB12 Lab Results  Component Value Date   TSH 3.970 01/16/2014    02/28/12 CT abd/pelvis 1.  Enlarged left inguinal lymph nodes measure up to 1.8 cm and are of indeterminate etiology.  Likely reactive.  Differential considerations include lymphoma and metastatic adenopathy.  03/11/16 CT abd/pelvis [I reviewed images myself and agree with interpretation. -VRP]  1. No explanation for persistent left lower quadrant pain. No adverse features status post left inguinal lymph node dissection. 2. Otherwise negative CT Abdomen and Pelvis. No lymphadenopathy in the abdomen or pelvis.  01/14/17 xray lumbar spine  - negative   ASSESSMENT AND PLAN  33 y.o. year old male here with:   Dx:  1. Lower extremity numbness   2. Pain in left testicle     PLAN:  - check MRI lumbar spine w/wo (eval testicular pain; lower extremity numbness; eval cauda equina syndrome) - follow up with PCP or pain mgmt  Orders Placed This Encounter  Procedures  . MR Lumbar Spine W Wo Contrast   Return pending test results, for pending if symptoms worsen or fail to improve.    Penni Bombard, MD 7/98/9211, 9:41 PM Certified in Neurology, Neurophysiology and Neuroimaging  [facility] [address], [place], [place] [address] [phone]

## 2020-01-07 NOTE — Patient Instructions (Signed)
-   check MRI lumbar spine 

## 2020-01-11 ENCOUNTER — Encounter: Payer: Self-pay | Admitting: Urology

## 2020-01-11 ENCOUNTER — Other Ambulatory Visit: Payer: Self-pay | Admitting: Urology

## 2020-01-11 NOTE — Telephone Encounter (Signed)
Please see refill request.

## 2020-01-15 ENCOUNTER — Other Ambulatory Visit: Payer: Self-pay | Admitting: Diagnostic Neuroimaging

## 2020-01-15 DIAGNOSIS — T1590XA Foreign body on external eye, part unspecified, unspecified eye, initial encounter: Secondary | ICD-10-CM

## 2020-01-16 NOTE — Telephone Encounter (Signed)
Patient called to follow up on his Mychart message that he sent on 01/11/20.

## 2020-01-28 ENCOUNTER — Ambulatory Visit
Admission: RE | Admit: 2020-01-28 | Discharge: 2020-01-28 | Disposition: A | Discharge status: Home / Self Care | Payer: Medicare Other | Source: Ambulatory Visit | Attending: Diagnostic Neuroimaging | Admitting: Diagnostic Neuroimaging

## 2020-01-28 ENCOUNTER — Other Ambulatory Visit: Payer: Self-pay

## 2020-01-28 DIAGNOSIS — N50812 Left testicular pain: Secondary | ICD-10-CM

## 2020-01-28 DIAGNOSIS — T1590XA Foreign body on external eye, part unspecified, unspecified eye, initial encounter: Secondary | ICD-10-CM

## 2020-01-28 DIAGNOSIS — R200 Anesthesia of skin: Secondary | ICD-10-CM

## 2020-01-28 MED ORDER — GADOBENATE DIMEGLUMINE 529 MG/ML IV SOLN
15.0000 mL | Freq: Once | INTRAVENOUS | Status: AC | PRN
  Administered 2020-01-28: 15 mL via INTRAVENOUS

## 2020-01-31 ENCOUNTER — Other Ambulatory Visit: Payer: Self-pay | Admitting: Urology

## 2020-01-31 NOTE — Telephone Encounter (Signed)
Patient called again today to f/u on mychart message. He states he needs refill on pain medication.

## 2020-02-01 NOTE — Telephone Encounter (Signed)
Refill request below:

## 2020-02-13 ENCOUNTER — Telehealth: Payer: Self-pay | Admitting: Diagnostic Neuroimaging

## 2020-02-13 NOTE — Telephone Encounter (Signed)
Received from my chart today: I'm in pain and I received my test results I was wondering if I will be getting a follow up visit to this office or where do I go from here? Can you proscribed me anything to help with the pain?  Routed this and result request to Dr Marjory Lies.

## 2020-02-13 NOTE — Telephone Encounter (Addendum)
Spoke with patient and informed him Dr Marjory Lies stated MRI lumbar spine didn't show any specific cause for his symptoms. He asked about bulging disks. I advised that Dr Marjory Lies didn't feel it needed surgical consult, could use conservative management. He recommends patient FU with PCP, Dr Waldon Reining, [facility], [place], [place] and urologist for FU on pain and management. Patient verbalized understanding, appreciation.

## 2020-02-13 NOTE — Telephone Encounter (Signed)
No specific spine cause found. Follow up with PCP and urology. -VRP

## 2020-02-13 NOTE — Telephone Encounter (Signed)
Shull,Katie(girlfriend on DPR) has called stating pt see's MRI information on My chart , he is asking for a call to discuss results or is asking if he needs to schedule and appointment to discuss results.  Shull,Katie is asking that pt be called

## 2020-03-26 ENCOUNTER — Encounter: Payer: Self-pay | Admitting: Urology

## 2020-03-26 ENCOUNTER — Ambulatory Visit (INDEPENDENT_AMBULATORY_CARE_PROVIDER_SITE_OTHER): Payer: Medicare Other | Admitting: Urology

## 2020-03-26 ENCOUNTER — Telehealth: Payer: Self-pay

## 2020-03-26 ENCOUNTER — Other Ambulatory Visit: Payer: Self-pay | Admitting: Urology

## 2020-03-26 ENCOUNTER — Other Ambulatory Visit: Payer: Self-pay

## 2020-03-26 VITALS — BP 115/72 | HR 92 | Temp 98.2°F | Ht 73.0 in | Wt 169.0 lb

## 2020-03-26 DIAGNOSIS — N50812 Left testicular pain: Secondary | ICD-10-CM | POA: Diagnosis not present

## 2020-03-26 NOTE — Progress Notes (Signed)
Urological Symptom Review  Patient is experiencing the following symptoms: Painful urination Trouble starting stream  Painful intercourse Weak stream Penile Pain   Review of Systems  Gastrointestinal (upper)  : Nausea Vomiting  Gastrointestinal (lower) : Negative for lower GI symptoms  Constitutional : Night Sweats  Skin: Negative for skin symptoms  Eyes: Negative for eye symptoms  Ear/Nose/Throat : Sinus problems  Hematologic/Lymphatic: Swollen glands  Cardiovascular : Chest pain  Respiratory : Cough Shortness of breath  Endocrine: Excessive thirst  Musculoskeletal: Back pain  Neurological: Negative for neurological symptoms  Psychologic: Negative for psychiatric symptoms

## 2020-03-26 NOTE — Patient Instructions (Signed)
Varicocele  A varicocele is a swelling of veins in the scrotum. The scrotum is the sac that contains the testicles. Varicoceles can occur on either side of the scrotum, but they are more common on the left side. They occur most often in teenage boys and young men. In most cases, varicoceles are not a serious problem. They are usually small and painless and do not require treatment. Tests may be done to confirm the diagnosis. Treatment may be needed if:  A varicocele is large, causes a lot of pain, or causes pain when exercising.  Varicoceles are found on both sides of the scrotum.  A varicocele causes a decrease in the size of the testicle in a growing adolescent.  The person has fertility problems. What are the causes? This condition is the result of valves in the veins not working properly. Valves in the veins help to return blood from the scrotum and testicles to the heart. If these valves do not work well, blood flows backward and backs up into the veins, which causes the veins to swell. This is similar to what happens when varicose veins form in the leg. What are the signs or symptoms? Most varicoceles do not cause any symptoms. If symptoms do occur, they may include:  Swelling on one side of the scrotum. The swelling may be more obvious when you are standing up.  A lumpy feeling in the scrotum.  A heavy feeling on one side of the scrotum.  A dull ache in the scrotum, especially after exercise or prolonged standing or sitting.  Slower growth or reduced size of the testicle on the side of the varicocele (in young males).  Problems with fathering a child (fertility). This can occur if the testicle does not grow normally or if the condition causes problems with the sperm, such as a low sperm count or sperm that are not able to reach the egg (poor motility). How is this diagnosed? This condition is diagnosed based on:  Your medical history.  A physical exam. Your health care  provider may inspect and feel (palpate) the scrotal area to check for swollen or enlarged veins.  An ultrasound. This may be done to confirm the diagnosis and to help rule out other causes of the swelling. How is this treated? Treatment is usually not needed for this condition. If you have any pain, your health care provider may prescribe or recommend medicine to help relieve it. You may need regular exams so your health care provider can monitor the varicocele to ensure that it does not cause problems. When further treatment is needed, it may involve one of these options:  Varicocelectomy. This is a surgery in which the swollen veins are tied off so that the flow of blood goes to other veins instead.  Embolization. In this procedure, a small, thin tube (catheter) is used to place metal coils or other blocking items in the veins. This cuts off the blood flow to the swollen veins. Follow these instructions at home:  Take over-the-counter and prescription medicines only as told by your health care provider.  Wear supportive underwear.  Use an athletic supporter when participating in sports activities.  Keep all follow-up visits as told by your health care provider. This is important. Contact a health care provider if:  Your pain is increasing.  You have redness in the affected area.  Your testicle becomes enlarged, swollen, or painful.  You have swelling that does not decrease when you are lying down.    One of your testicles is smaller than the other. Get help right away if:  You develop swelling in your legs.  You have difficulty breathing. Summary  Varicocele is a condition in which the veins in the scrotum are swollen or enlarged.  In most cases, varicoceles do not require treatment.  Treatment may be needed if you have pain, have problems with infertility, or have a smaller testicle associated with the varicocele.  In some cases, the condition may be treated with a  procedure to cut off the flow of blood to the swollen veins. This information is not intended to replace advice given to you by your health care provider. Make sure you discuss any questions you have with your health care provider. Document Revised: 02/16/2019 Document Reviewed: 02/16/2019 Elsevier Patient Education  2020 [employer]  

## 2020-03-26 NOTE — Telephone Encounter (Signed)
Spoke with a woman who called saying pt wanted her to call for him because he was just seen in the office and was driving and was asking about a refill on a pain med. She was saying something about Mychart and going into refill it. I didn't understand entirely what was meant with that. I looked back in Dr. Dimas Millin note and he said for pt to continue Meloxicam. I told woman and she thought it was an opiate. I told her his note said Meloxicam instead. She expressed understanding.

## 2020-03-26 NOTE — Progress Notes (Signed)
03/26/2020 4:16 PM   Rosalie Gums July 03, 1986 [id number]  Referring provider: Junie Spencer, FNP [address] [place],  [place] [address]  Left orchalgia  HPI: Mr Kincy is a 33yo here for followup for left orchalgia. He is taking meloxicam 15mg . He underwent spinal MRI and was diagnosed with bulging lumbar discs but no nerve compression. He has a pain clinic appointment on 04/01/2020. He continues to have sharp intemrittent left testis pain.    PMH: Past Medical History:  Diagnosis Date  . Bipolar 1 disorder (HCC)    no current med.  04/03/2020 GERD (gastroesophageal reflux disease)    TUMS as needed  . History of gastric ulcer   . History of lymphadenopathy    bilateral inguinal s/p  bx left side -- per pt negative  path  . Left testicular pain   . Manic depression (HCC)   . Migraines   . Schizophrenia (HCC)    no current med.  . Urinary hesitancy   . Urinary urgency   . Wears dentures    upper  . Wears partial dentures    lower    Surgical History: Past Surgical History:  Procedure Laterality Date  . APPENDECTOMY  age 67  . INGUINAL LYMPH NODE BIOPSY Left 02/04/2016   Procedure: LEFT INGUINAL LYMPH NODE BIOPSY;  Surgeon: 02/06/2016, MD;  Location: [facility];  Service: General;  Laterality: Left;  Manus Rudd VARICOCELECTOMY Left 07/26/2016   Procedure: LEFT NEUROLYSIS VARICOCELECTOMY;  Surgeon: 07/28/2016, MD;  Location: [facility];  Service: Urology;  Laterality: Left;  90 MINS    Home Medications:  Allergies as of 03/26/2020      Reactions   Darvon [propoxyphene] Nausea And Vomiting   Tramadol Other (See Comments)   headache      Medication List       Accurate as of March 26, 2020  4:16 PM. If you have any questions, ask your nurse or doctor.        hydrOXYzine 25 MG tablet Commonly known as: ATARAX/VISTARIL Take 25 mg by mouth every 8 (eight) hours as needed.   meloxicam 7.5 MG tablet Commonly known as: Mobic Take 1  tablet (7.5 mg total) by mouth daily.   oxyCODONE-acetaminophen 10-325 MG tablet Commonly known as: Percocet Take 1 tablet by mouth every 8 (eight) hours as needed for pain.   QUEtiapine 50 MG tablet Commonly known as: SEROQUEL Take 200 mg by mouth at bedtime.   venlafaxine XR 150 MG 24 hr capsule Commonly known as: EFFEXOR-XR Take 150 mg by mouth daily. What changed: Another medication with the same name was removed. Continue taking this medication, and follow the directions you see here. Changed by: March 28, 2020, MD       Allergies:  Allergies  Allergen Reactions  . Darvon [Propoxyphene] Nausea And Vomiting  . Tramadol Other (See Comments)    headache    Family History: Family History  Problem Relation Age of Onset  . Fibromyalgia Mother   . Diabetes Mother   . Other Brother        car accident    Social History:  reports that he has been smoking cigarettes. He has a 48.00 pack-year smoking history. He has never used smokeless tobacco. He reports current alcohol use. He reports that he does not use drugs.  ROS: All other review of systems were reviewed and are negative except what is noted above in HPI  Physical Exam: BP 115/72  Pulse 92   Temp 98.2 F (36.8 C)   Ht 6\' 1"  (1.854 m)   Wt 169 lb (76.7 kg)   BMI 22.30 kg/m   Constitutional:  Alert and oriented, No acute distress. HEENT:  AT, moist mucus membranes.  Trachea midline, no masses. Cardiovascular: No clubbing, cyanosis, or edema. Respiratory: Normal respiratory effort, no increased work of breathing. GI: Abdomen is soft, nontender, nondistended, no abdominal masses GU: No CVA tenderness.  Lymph: No cervical or inguinal lymphadenopathy. Skin: No rashes, bruises or suspicious lesions. Neurologic: Grossly intact, no focal deficits, moving all 4 extremities. Psychiatric: Normal mood and affect.  Laboratory Data: Lab Results  Component Value Date   WBC 14.1 (H) 02/27/2016   HGB 15.3  07/26/2016   HCT 44.1 02/27/2016   MCV 88 02/27/2016   PLT 202 02/27/2016    Lab Results  Component Value Date   CREATININE 0.76 04/29/2016    No results found for: PSA  No results found for: TESTOSTERONE  No results found for: HGBA1C  Urinalysis    Component Value Date/Time   COLORURINE YELLOW 02/28/2012 2250   APPEARANCEUR CLEAR 02/28/2012 2250   LABSPEC <1.005 (L) 02/28/2012 2250   PHURINE 6.0 02/28/2012 2250   GLUCOSEU NEGATIVE 02/28/2012 2250   HGBUR NEGATIVE 02/28/2012 2250   BILIRUBINUR neg 12/19/2015 1159   KETONESUR NEGATIVE 02/28/2012 2250   PROTEINUR neg 12/19/2015 1159   PROTEINUR NEGATIVE 02/28/2012 2250   UROBILINOGEN negative 12/19/2015 1159   UROBILINOGEN 0.2 02/28/2012 2250   NITRITE neg 12/19/2015 1159   NITRITE NEGATIVE 02/28/2012 2250   LEUKOCYTESUR Negative 12/19/2015 1159    Lab Results  Component Value Date   MUCUS neg 12/19/2015   BACTERIA neg 12/19/2015    Pertinent Imaging:  Results for orders placed in visit on 01/17/14  DG Abd 1 View   Narrative CLINICAL DATA:  Abdominal pain  EXAM: ABDOMEN - 1 VIEW  COMPARISON:  CT, 02/1912  FINDINGS: The bowel gas pattern is normal. No radio-opaque calculi or other significant radiographic abnormality are seen.  IMPRESSION: Negative.   Electronically Signed   By: Lajean Manes M.D.   On: 01/17/2014 13:39    No results found for this or any previous visit. No results found for this or any previous visit. No results found for this or any previous visit. No results found for this or any previous visit. No results found for this or any previous visit. No results found for this or any previous visit. No results found for this or any previous visit.  Assessment & Plan:    1. Pain in left testicle -continue meloxicam 15mg  daily -RTC 6 months   No follow-ups on file.  Nicolette Bang, MD  [facility] [place]

## 2020-03-27 NOTE — Telephone Encounter (Signed)
Please see request

## 2020-04-03 DIAGNOSIS — F319 Bipolar disorder, unspecified: Secondary | ICD-10-CM | POA: Diagnosis present

## 2020-04-03 DIAGNOSIS — F209 Schizophrenia, unspecified: Secondary | ICD-10-CM | POA: Diagnosis present

## 2020-10-01 ENCOUNTER — Ambulatory Visit (INDEPENDENT_AMBULATORY_CARE_PROVIDER_SITE_OTHER): Payer: Medicare HMO | Admitting: Urology

## 2020-10-01 ENCOUNTER — Other Ambulatory Visit: Payer: Self-pay

## 2020-10-01 ENCOUNTER — Encounter: Payer: Self-pay | Admitting: Urology

## 2020-10-01 ENCOUNTER — Ambulatory Visit: Payer: Medicaid Other | Admitting: Urology

## 2020-10-01 VITALS — BP 104/66 | HR 80 | Temp 98.6°F | Ht 73.0 in | Wt 169.0 lb

## 2020-10-01 DIAGNOSIS — N50812 Left testicular pain: Secondary | ICD-10-CM | POA: Diagnosis not present

## 2020-10-01 DIAGNOSIS — N411 Chronic prostatitis: Secondary | ICD-10-CM | POA: Diagnosis not present

## 2020-10-01 LAB — URINALYSIS, ROUTINE W REFLEX MICROSCOPIC
Bilirubin, UA: NEGATIVE
Glucose, UA: NEGATIVE
Ketones, UA: NEGATIVE
Leukocytes,UA: NEGATIVE
Nitrite, UA: NEGATIVE
Protein,UA: NEGATIVE
RBC, UA: NEGATIVE
Specific Gravity, UA: 1.02 (ref 1.005–1.030)
Urobilinogen, Ur: 4 mg/dL — ABNORMAL HIGH (ref 0.2–1.0)
pH, UA: 7 (ref 5.0–7.5)

## 2020-10-01 MED ORDER — TAMSULOSIN HCL 0.4 MG PO CAPS: 0.4000 mg | ORAL_CAPSULE | Freq: Every day | ORAL | 11 refills | Status: DC

## 2020-10-01 NOTE — Patient Instructions (Signed)

## 2020-10-01 NOTE — Progress Notes (Signed)
10/01/2020 3:07 PM   Steven Quinn March 21, 1986 [id number]  Referring provider: Waldon Reining, MD [address] [place],  [place] [address]  Followup left testis pain and weak urine stream  HPI: Steven Quinn is a 34yo here for folllowup testis pain. He has chronic left testis pain and has found that valium has improved his testis pain. He has new onset weak urinary stream, straining to urinate and intermittent dysuria for the past 3 months. No hx of prostatitis. He has urinary frequency. He has nocturia 0-2x.   PMH: Past Medical History:  Diagnosis Date  . Bipolar 1 disorder (HCC)    no current med.  Marland Kitchen GERD (gastroesophageal reflux disease)    TUMS as needed  . History of gastric ulcer   . History of lymphadenopathy    bilateral inguinal s/p  bx left side -- per pt negative  path  . Left testicular pain   . Manic depression (HCC)   . Migraines   . Schizophrenia (HCC)    no current med.  . Urinary hesitancy   . Urinary urgency   . Wears dentures    upper  . Wears partial dentures    lower    Surgical History: Past Surgical History:  Procedure Laterality Date  . APPENDECTOMY  age 49  . INGUINAL LYMPH NODE BIOPSY Left 02/04/2016   Procedure: LEFT INGUINAL LYMPH NODE BIOPSY;  Surgeon: Steven Rudd, MD;  Location: [facility];  Service: General;  Laterality: Left;  Marland Kitchen VARICOCELECTOMY Left 07/26/2016   Procedure: LEFT NEUROLYSIS VARICOCELECTOMY;  Surgeon: Steven Gauze, MD;  Location: [facility];  Service: Urology;  Laterality: Left;  90 MINS    Home Medications:  Allergies as of 10/01/2020      Reactions   Darvon [propoxyphene] Nausea And Vomiting   Oxycodone-acetaminophen Nausea And Vomiting   Pollen Extract    Tramadol Other (See Comments)   headache      Medication List       Accurate as of October 01, 2020  3:07 PM. If you have any questions, ask your nurse or doctor.        STOP taking these medications   meloxicam 7.5  MG tablet Commonly known as: Mobic Stopped by: Steven Aye, MD     TAKE these medications   cyclobenzaprine 10 MG tablet Commonly known as: FLEXERIL Take 10 mg by mouth 3 (three) times daily.   diazepam 10 MG tablet Commonly known as: VALIUM Take 10 mg by mouth 2 (two) times daily.   hydrOXYzine 25 MG tablet Commonly known as: ATARAX/VISTARIL Take 25 mg by mouth every 8 (eight) hours as needed.   oxyCODONE-acetaminophen 10-325 MG tablet Commonly known as: Percocet Take 1 tablet by mouth every 8 (eight) hours as needed for pain.   pregabalin 150 MG capsule Commonly known as: LYRICA Take 150 mg by mouth 2 (two) times daily.   QUEtiapine 300 MG tablet Commonly known as: SEROQUEL Take 300 mg by mouth Quinn bedtime. What changed: Another medication with the same name was removed. Continue taking this medication, and follow the directions you see here. Changed by: Steven Aye, MD   venlafaxine XR 75 MG 24 hr capsule Commonly known as: EFFEXOR-XR Take 75 mg by mouth daily. What changed: Another medication with the same name was removed. Continue taking this medication, and follow the directions you see here. Changed by: Steven Aye, MD       Allergies:  Allergies  Allergen Reactions  .  Darvon [Propoxyphene] Nausea And Vomiting  . Oxycodone-Acetaminophen Nausea And Vomiting  . Pollen Extract   . Tramadol Other (See Comments)    headache    Family History: Family History  Problem Relation Age of Onset  . Fibromyalgia Mother   . Diabetes Mother   . Other Brother        car accident    Social History:  reports that he has been smoking cigarettes. He has a 48.00 pack-year smoking history. He has never used smokeless tobacco. He reports current alcohol use. He reports that he does not use drugs.  ROS: All other review of systems were reviewed and are negative except what is noted above in HPI  Physical Exam: BP 104/66   Pulse 80   Temp 98.6 F (37  C)   Ht 6\' 1"  (1.854 m)   Wt 169 lb (76.7 kg)   BMI 22.30 kg/m   Constitutional:  Alert and oriented, No acute distress. HEENT: Steven Quinn, moist mucus membranes.  Trachea midline, no masses. Cardiovascular: No clubbing, cyanosis, or edema. Respiratory: Normal respiratory effort, no increased work of breathing. GI: Abdomen is soft, nontender, nondistended, no abdominal masses GU: No CVA tenderness.  Lymph: No cervical or inguinal lymphadenopathy. Skin: No rashes, bruises or suspicious lesions. Neurologic: Grossly intact, no focal deficits, moving all 4 extremities. Psychiatric: Normal mood and affect.  Laboratory Data: Lab Results  Component Value Date   WBC 14.1 (H) 02/27/2016   HGB 15.3 07/26/2016   HCT 44.1 02/27/2016   MCV 88 02/27/2016   PLT 202 02/27/2016    Lab Results  Component Value Date   CREATININE 0.76 04/29/2016    No results found for: PSA  No results found for: TESTOSTERONE  No results found for: HGBA1C  Urinalysis    Component Value Date/Time   COLORURINE YELLOW 02/28/2012 2250   APPEARANCEUR CLEAR 02/28/2012 2250   LABSPEC <1.005 (L) 02/28/2012 2250   PHURINE 6.0 02/28/2012 2250   GLUCOSEU NEGATIVE 02/28/2012 2250   HGBUR NEGATIVE 02/28/2012 2250   BILIRUBINUR neg 12/19/2015 1159   KETONESUR NEGATIVE 02/28/2012 2250   PROTEINUR neg 12/19/2015 1159   PROTEINUR NEGATIVE 02/28/2012 2250   UROBILINOGEN negative 12/19/2015 1159   UROBILINOGEN 0.2 02/28/2012 2250   NITRITE neg 12/19/2015 1159   NITRITE NEGATIVE 02/28/2012 2250   LEUKOCYTESUR Negative 12/19/2015 1159    Lab Results  Component Value Date   MUCUS neg 12/19/2015   BACTERIA neg 12/19/2015    Pertinent Imaging:  Results for orders placed in visit on 01/17/14  DG Abd 1 View  Narrative CLINICAL DATA:  Abdominal pain  EXAM: ABDOMEN - 1 VIEW  COMPARISON:  CT, 02/1912  FINDINGS: The bowel gas pattern is normal. No radio-opaque calculi or other significant radiographic  abnormality are seen.  IMPRESSION: Negative.   Electronically Signed By: 03/1912 M.D. On: 01/17/2014 13:39  No results found for this or any previous visit.  No results found for this or any previous visit.  No results found for this or any previous visit.  No results found for this or any previous visit.  No results found for this or any previous visit.  No results found for this or any previous visit.  No results found for this or any previous visit.   Assessment & Plan:    1. Pain in left testicle -continue valium prn - Urinalysis, Routine w reflex microscopic  2. Chronic prostatitis -We will trial flomax 0.4mg  daily -RTC 6 weeks   No  follow-ups on file.  Nicolette Bang, MD  [facility] [place]

## 2020-10-01 NOTE — Progress Notes (Signed)
Urological Symptom Review  Patient is experiencing the following symptoms: Burning/pain with urination Trouble starting stream Have to strain to urinate Painful intercourse   Review of Systems  Gastrointestinal (upper)  : Negative for upper GI symptoms  Gastrointestinal (lower) : Negative for lower GI symptoms  Constitutional : Night Sweats Weight loss  Skin: Negative for skin symptoms  Eyes: Blurred vision  Ear/Nose/Throat : Negative for Ear/Nose/Throat symptoms  Hematologic/Lymphatic: Swollen glands  Cardiovascular : Chest pain  Respiratory : Negative for respiratory symptoms  Endocrine: Negative for endocrine symptoms  Musculoskeletal: Back pain Joint pain  Neurological: Headaches dizziness  Psychologic: Depression Anxiety

## 2020-11-13 ENCOUNTER — Ambulatory Visit: Payer: Medicare HMO | Admitting: Urology

## 2020-12-17 ENCOUNTER — Encounter: Payer: Self-pay | Admitting: Urology

## 2020-12-17 ENCOUNTER — Other Ambulatory Visit: Payer: Self-pay

## 2020-12-17 ENCOUNTER — Ambulatory Visit (INDEPENDENT_AMBULATORY_CARE_PROVIDER_SITE_OTHER): Payer: Medicare HMO | Admitting: Urology

## 2020-12-17 VITALS — BP 112/65 | HR 95 | Temp 98.6°F | Ht 73.0 in | Wt 155.0 lb

## 2020-12-17 DIAGNOSIS — N50812 Left testicular pain: Secondary | ICD-10-CM

## 2020-12-17 DIAGNOSIS — N411 Chronic prostatitis: Secondary | ICD-10-CM

## 2020-12-17 MED ORDER — TAMSULOSIN HCL 0.4 MG PO CAPS: 0.4000 mg | ORAL_CAPSULE | Freq: Every day | ORAL | 11 refills | Status: DC

## 2020-12-17 MED ORDER — DIAZEPAM 10 MG PO TABS: 10.0000 mg | ORAL_TABLET | Freq: Every day | ORAL | 2 refills | Status: DC | PRN

## 2020-12-17 NOTE — Progress Notes (Signed)
12/17/2020 2:00 PM   Steven Quinn May 24, 1986 [id number]  Referring provider: Leonie Douglas, MD [address] [place],  [place] [address]  followup prostatitis and left testicular pain  HPI: Steven Quinn is a 35yo here for followup chronic prostatitis and testis pain. He was started on flomax 0.4mg  last visit which improved the urinary stream, urinary hesitancy resolved, nocturia 0-1x. Overall he is happy with his urination. His left testis pain is intermittent, sharp, mild to moderate. It is alleviated with the valium.    PMH: Past Medical History:  Diagnosis Date  . Bipolar 1 disorder ([place])    no current med.  Marland Kitchen GERD (gastroesophageal reflux disease)    TUMS as needed  . History of gastric ulcer   . History of lymphadenopathy    bilateral inguinal s/p  bx left side -- per pt negative  path  . Left testicular pain   . Manic depression ([place])   . Migraines   . Schizophrenia ([place])    no current med.  . Urinary hesitancy   . Urinary urgency   . Wears dentures    upper  . Wears partial dentures    lower    Surgical History: Past Surgical History:  Procedure Laterality Date  . APPENDECTOMY  age 26  . INGUINAL LYMPH NODE BIOPSY Left 02/04/2016   Procedure: LEFT INGUINAL LYMPH NODE BIOPSY;  Surgeon: Donnie Mesa, MD;  Location: [facility];  Service: General;  Laterality: Left;  Marland Kitchen VARICOCELECTOMY Left 07/26/2016   Procedure: LEFT NEUROLYSIS VARICOCELECTOMY;  Surgeon: Cleon Gustin, MD;  Location: [facility];  Service: Urology;  Laterality: Left;  90 MINS    Home Medications:  Allergies as of 12/17/2020      Reactions   Darvon [propoxyphene] Nausea And Vomiting   Other Other (See Comments)   Oxycodone-acetaminophen Nausea And Vomiting   Pollen Extract    Tramadol Other (See Comments)   headache      Medication List       Accurate as of December 17, 2020  2:00 PM. If you have any questions, ask your nurse or doctor.         buprenorphine 5 MCG/HR Ptwk Commonly known as: BUTRANS Butrans 5 mcg/hour transdermal patch  Apply 1 patch every week by transdermal route.   cyclobenzaprine 10 MG tablet Commonly known as: FLEXERIL Take 10 mg by mouth 3 (three) times daily.   diazepam 10 MG tablet Commonly known as: VALIUM Take 10 mg by mouth 2 (two) times daily.   hydrOXYzine 25 MG tablet Commonly known as: ATARAX/VISTARIL Take 25 mg by mouth every 8 (eight) hours as needed.   meloxicam 15 MG tablet Commonly known as: MOBIC 1 tablet   ondansetron 8 MG tablet Commonly known as: ZOFRAN 1 tablet as needed for nausea   oxyCODONE-acetaminophen 10-325 MG tablet Commonly known as: Percocet Take 1 tablet by mouth every 8 (eight) hours as needed for pain.   pregabalin 150 MG capsule Commonly known as: LYRICA Take 150 mg by mouth 2 (two) times daily.   QUEtiapine 300 MG tablet Commonly known as: SEROQUEL Take 300 mg by mouth at bedtime.   tamsulosin 0.4 MG Caps capsule Commonly known as: FLOMAX Take 1 capsule (0.4 mg total) by mouth daily.   venlafaxine XR 75 MG 24 hr capsule Commonly known as: EFFEXOR-XR Take 75 mg by mouth daily.       Allergies:  Allergies  Allergen Reactions  . Darvon [Propoxyphene] Nausea  And Vomiting  . Other Other (See Comments)  . Oxycodone-Acetaminophen Nausea And Vomiting  . Pollen Extract   . Tramadol Other (See Comments)    headache    Family History: Family History  Problem Relation Age of Onset  . Fibromyalgia Mother   . Diabetes Mother   . Other Brother        car accident    Social History:  reports that he has been smoking cigarettes. He has a 48.00 pack-year smoking history. He has never used smokeless tobacco. He reports current alcohol use. He reports that he does not use drugs.  ROS: All other review of systems were reviewed and are negative except what is noted above in HPI  Physical Exam: BP 112/65   Pulse 95   Temp 98.6 F (37 C)   Ht 6'  1" (1.854 m)   Wt 155 lb (70.3 kg)   BMI 20.45 kg/m   Constitutional:  Alert and oriented, No acute distress. HEENT: [place] AT, moist mucus membranes.  Trachea midline, no masses. Cardiovascular: No clubbing, cyanosis, or edema. Respiratory: Normal respiratory effort, no increased work of breathing. GI: Abdomen is soft, nontender, nondistended, no abdominal masses GU: No CVA tenderness.  Lymph: No cervical or inguinal lymphadenopathy. Skin: No rashes, bruises or suspicious lesions. Neurologic: Grossly intact, no focal deficits, moving all 4 extremities. Psychiatric: Normal mood and affect.  Laboratory Data: Lab Results  Component Value Date   WBC 14.1 (H) 02/27/2016   HGB 15.3 07/26/2016   HCT 44.1 02/27/2016   MCV 88 02/27/2016   PLT 202 02/27/2016    Lab Results  Component Value Date   CREATININE 0.76 04/29/2016    No results found for: PSA  No results found for: TESTOSTERONE  No results found for: HGBA1C  Urinalysis    Component Value Date/Time   COLORURINE YELLOW 02/28/2012 2250   APPEARANCEUR Clear 10/01/2020 1501   LABSPEC <1.005 (L) 02/28/2012 2250   PHURINE 6.0 02/28/2012 2250   GLUCOSEU Negative 10/01/2020 1501   HGBUR NEGATIVE 02/28/2012 2250   BILIRUBINUR Negative 10/01/2020 1501   KETONESUR NEGATIVE 02/28/2012 2250   PROTEINUR Negative 10/01/2020 1501   PROTEINUR NEGATIVE 02/28/2012 2250   UROBILINOGEN negative 12/19/2015 1159   UROBILINOGEN 0.2 02/28/2012 2250   NITRITE Negative 10/01/2020 1501   NITRITE NEGATIVE 02/28/2012 2250   LEUKOCYTESUR Negative 10/01/2020 1501    Lab Results  Component Value Date   LABMICR Comment 10/01/2020   MUCUS neg 12/19/2015   BACTERIA neg 12/19/2015    Pertinent Imaging:  Results for orders placed in visit on 01/17/14  DG Abd 1 View  Narrative CLINICAL DATA:  Abdominal pain  EXAM: ABDOMEN - 1 VIEW  COMPARISON:  CT, 02/1912  FINDINGS: The bowel gas pattern is normal. No radio-opaque calculi or  other significant radiographic abnormality are seen.  IMPRESSION: Negative.   Electronically Signed By: Amie Portland M.D. On: 01/17/2014 13:39  No results found for this or any previous visit.  No results found for this or any previous visit.  No results found for this or any previous visit.  No results found for this or any previous visit.  No results found for this or any previous visit.  No results found for this or any previous visit.  No results found for this or any previous visit.   Assessment & Plan:    1. Pain in left testicle -continue valium daily prn. rx refilled - Urinalysis, Routine w reflex microscopic  2. Chronic prostatitis without  hematuria -Continue flomax 0.4mg  daily -RTC 6 months   No follow-ups on file.  Wilkie Aye, MD  [facility] [place]

## 2020-12-17 NOTE — Patient Instructions (Signed)
Prostatitis  Prostatitis is swelling of the prostate gland. The prostate helps to make semen. It is below a man's bladder, in front of the rectum. There are different types of prostatitis. Follow these instructions at home:   Take over-the-counter and prescription medicines only as told by your doctor.  If you were prescribed an antibiotic medicine, take it as told by your doctor. Do not stop taking the antibiotic even if you start to feel better.  If your doctor prescribed exercises, do them as directed.  Take sitz baths as told by your doctor. To take a sitz bath, sit in warm water that is deep enough to cover your hips and butt.  Keep all follow-up visits as told by your doctor. This is important. Contact a doctor if:  Your symptoms get worse.  You have a fever. Get help right away if:  You have chills.  You feel sick to your stomach (nauseous).  You throw up (vomit).  You feel light-headed.  You feel like you might pass out (faint).  You cannot pee (urinate).  You have blood or clumps of blood (blood clots) in your pee (urine). This information is not intended to replace advice given to you by your health care provider. Make sure you discuss any questions you have with your health care provider. Document Revised: 11/11/2017 Document Reviewed: 08/19/2016 Elsevier Patient Education  2020 Elsevier Inc.  

## 2020-12-17 NOTE — Progress Notes (Signed)
Urological Symptom Review  Patient is experiencing the following symptoms: Burning/pain with urination Painful intercourse Penile pain (male only)    Review of Systems  Gastrointestinal (upper)  : Negative for upper GI symptoms  Gastrointestinal (lower) : Negative for lower GI symptoms  Constitutional : Negative for symptoms  Skin: Negative for skin symptoms  Eyes: Blurred vision  Ear/Nose/Throat : Sinus problems  Hematologic/Lymphatic: Swollen glands  Cardiovascular : Negative for cardiovascular symptoms  Respiratory : Cough  Endocrine: Negative for endocrine symptoms  Musculoskeletal: Back pain Joint pain  Neurological: Headaches Dizziness  Psychologic: Depression Anxiety

## 2020-12-18 LAB — URINALYSIS, ROUTINE W REFLEX MICROSCOPIC
Bilirubin, UA: NEGATIVE
Glucose, UA: NEGATIVE
Ketones, UA: NEGATIVE
Leukocytes,UA: NEGATIVE
Nitrite, UA: NEGATIVE
Protein,UA: NEGATIVE
Specific Gravity, UA: 1.02 (ref 1.005–1.030)
Urobilinogen, Ur: 0.2 mg/dL (ref 0.2–1.0)
pH, UA: 7.5 (ref 5.0–7.5)

## 2021-01-07 DIAGNOSIS — R69 Illness, unspecified: Secondary | ICD-10-CM | POA: Diagnosis not present

## 2021-01-07 DIAGNOSIS — F411 Generalized anxiety disorder: Secondary | ICD-10-CM | POA: Diagnosis not present

## 2021-01-07 DIAGNOSIS — F439 Reaction to severe stress, unspecified: Secondary | ICD-10-CM | POA: Diagnosis not present

## 2021-01-12 DIAGNOSIS — T1501XA Foreign body in cornea, right eye, initial encounter: Secondary | ICD-10-CM | POA: Diagnosis not present

## 2021-01-29 DIAGNOSIS — F439 Reaction to severe stress, unspecified: Secondary | ICD-10-CM | POA: Diagnosis not present

## 2021-01-29 DIAGNOSIS — R69 Illness, unspecified: Secondary | ICD-10-CM | POA: Diagnosis not present

## 2021-01-29 DIAGNOSIS — F411 Generalized anxiety disorder: Secondary | ICD-10-CM | POA: Diagnosis not present

## 2021-02-12 DIAGNOSIS — F312 Bipolar disorder, current episode manic severe with psychotic features: Secondary | ICD-10-CM | POA: Diagnosis not present

## 2021-02-12 DIAGNOSIS — F411 Generalized anxiety disorder: Secondary | ICD-10-CM | POA: Diagnosis not present

## 2021-02-12 DIAGNOSIS — F439 Reaction to severe stress, unspecified: Secondary | ICD-10-CM | POA: Diagnosis not present

## 2021-04-09 DIAGNOSIS — F411 Generalized anxiety disorder: Secondary | ICD-10-CM | POA: Diagnosis not present

## 2021-04-09 DIAGNOSIS — F312 Bipolar disorder, current episode manic severe with psychotic features: Secondary | ICD-10-CM | POA: Diagnosis not present

## 2021-04-09 DIAGNOSIS — F439 Reaction to severe stress, unspecified: Secondary | ICD-10-CM | POA: Diagnosis not present

## 2021-06-18 ENCOUNTER — Ambulatory Visit: Payer: Medicare HMO | Admitting: Urology

## 2021-06-19 ENCOUNTER — Ambulatory Visit: Payer: Medicare HMO | Admitting: Urology

## 2021-07-20 ENCOUNTER — Encounter: Payer: Self-pay | Admitting: Urology

## 2021-07-20 ENCOUNTER — Other Ambulatory Visit: Payer: Self-pay

## 2021-07-20 ENCOUNTER — Telehealth: Payer: Self-pay

## 2021-07-20 ENCOUNTER — Ambulatory Visit (INDEPENDENT_AMBULATORY_CARE_PROVIDER_SITE_OTHER): Payer: Medicare HMO | Admitting: Urology

## 2021-07-20 VITALS — BP 101/67 | HR 86 | Ht 73.0 in | Wt 152.2 lb

## 2021-07-20 DIAGNOSIS — N50812 Left testicular pain: Secondary | ICD-10-CM | POA: Diagnosis not present

## 2021-07-20 DIAGNOSIS — N411 Chronic prostatitis: Secondary | ICD-10-CM | POA: Diagnosis not present

## 2021-07-20 MED ORDER — TAMSULOSIN HCL 0.4 MG PO CAPS: 0.4000 mg | ORAL_CAPSULE | Freq: Every day | ORAL | 11 refills | Status: DC

## 2021-07-20 MED ORDER — DIAZEPAM 10 MG PO TABS: 10.0000 mg | ORAL_TABLET | Freq: Every day | ORAL | 2 refills | Status: DC | PRN

## 2021-07-20 NOTE — Progress Notes (Signed)
Urological Symptom Review  Patient is experiencing the following symptoms: Hard to postpone urination Burning/pain with urination Get up at night to urinate Stream starts and stops Trouble starting stream Have to strain to urinate Painful intercourse   Review of Systems  Gastrointestinal (upper)  : Nausea Vomiting  Gastrointestinal (lower) : Diarrhea  Constitutional : Night Sweats  Skin: Negative for skin symptoms  Eyes: Negative for eye symptoms  Ear/Nose/Throat : Negative for Ear/Nose/Throat symptoms  Hematologic/Lymphatic: Swollen glands  Cardiovascular : Chest pain  Respiratory : Negative for respiratory symptoms  Endocrine: Negative for endocrine symptoms  Musculoskeletal: Back pain Joint pain  Neurological: Headaches  Psychologic: Depression Anxiety

## 2021-07-20 NOTE — Patient Instructions (Signed)

## 2021-07-20 NOTE — Telephone Encounter (Signed)
Spoke with patient, reviewed AVS with patient and medications sent to pharmacy. Patient stated he thought he was going to get narcotic for his pain- reviewed with Dr. Ronne Binning who denied and said he wrote for valium only. Patient voiced understanding.

## 2021-07-20 NOTE — Progress Notes (Signed)
07/20/2021 1:59 PM   Steven Quinn 1986-03-03 [id number]  Referring provider: Waldon Reining, MD [address] [place],  [place] [address]  Followup left testicular pain   HPI: [name] is a 35yo here for followup for chronic prostatitis and left testicular pain. He has been out of both the flomax and the valium for 2 months. He notes his urination is worse and the urinary frequency/urgency is worse. He continues to have intermittent, sharp, mild to moderate non radiating left testis pain. No other associated symptoms. No exacerbating events. Pain and discomfort is better with the valium.    PMH: Past Medical History:  Diagnosis Date   Bipolar 1 disorder ([facility])    no current med.   GERD (gastroesophageal reflux disease)    TUMS as needed   History of gastric ulcer    History of lymphadenopathy    bilateral inguinal s/p  bx left side -- per pt negative  path   Left testicular pain    Manic depression ([facility])    Migraines    Schizophrenia ([facility])    no current med.   Urinary hesitancy    Urinary urgency    Wears dentures    upper   Wears partial dentures    lower    Surgical History: Past Surgical History:  Procedure Laterality Date   APPENDECTOMY  age 29   INGUINAL LYMPH NODE BIOPSY Left 02/04/2016   Procedure: LEFT INGUINAL LYMPH NODE BIOPSY;  Surgeon: Steven Rudd, MD;  Location: [facility];  Service: General;  Laterality: Left;   VARICOCELECTOMY Left 07/26/2016   Procedure: LEFT NEUROLYSIS VARICOCELECTOMY;  Surgeon: Steven Gauze, MD;  Location: [facility];  Service: Urology;  Laterality: Left;  90 MINS    Home Medications:  Allergies as of 07/20/2021       Reactions   Darvon [propoxyphene] Nausea And Vomiting   Other Other (See Comments)   Oxycodone-acetaminophen Nausea And Vomiting   Pollen Extract    Tramadol Other (See Comments)   headache        Medication List        Accurate as of July 20, 2021  1:59 PM. If  you have any questions, ask your nurse or doctor.          buprenorphine 5 MCG/HR Ptwk Commonly known as: BUTRANS Butrans 5 mcg/hour transdermal patch  Apply 1 patch every week by transdermal route.   cyclobenzaprine 10 MG tablet Commonly known as: FLEXERIL Take 10 mg by mouth 3 (three) times daily.   diazepam 10 MG tablet Commonly known as: VALIUM Take 1 tablet (10 mg total) by mouth daily as needed for anxiety.   hydrOXYzine 25 MG tablet Commonly known as: ATARAX/VISTARIL Take 25 mg by mouth every 8 (eight) hours as needed.   meloxicam 15 MG tablet Commonly known as: MOBIC 1 tablet   ondansetron 8 MG tablet Commonly known as: ZOFRAN 1 tablet as needed for nausea   oxyCODONE-acetaminophen 10-325 MG tablet Commonly known as: Percocet Take 1 tablet by mouth every 8 (eight) hours as needed for pain.   pregabalin 150 MG capsule Commonly known as: LYRICA Take 150 mg by mouth 2 (two) times daily.   QUEtiapine 300 MG tablet Commonly known as: SEROQUEL Take 300 mg by mouth at bedtime.   tamsulosin 0.4 MG Caps capsule Commonly known as: FLOMAX Take 1 capsule (0.4 mg total) by mouth daily.   venlafaxine XR 75 MG 24 hr capsule Commonly known  as: EFFEXOR-XR Take 75 mg by mouth daily.        Allergies:  Allergies  Allergen Reactions   Darvon [Propoxyphene] Nausea And Vomiting   Other Other (See Comments)   Oxycodone-Acetaminophen Nausea And Vomiting   Pollen Extract    Tramadol Other (See Comments)    headache    Family History: Family History  Problem Relation Age of Onset   Fibromyalgia Mother    Diabetes Mother    Other Brother        car accident    Social History:  reports that he has been smoking cigarettes. He has a 48.00 pack-year smoking history. He has never used smokeless tobacco. He reports current alcohol use. He reports that he does not use drugs.  ROS: All other review of systems were reviewed and are negative except what is noted above  in HPI  Physical Exam: BP 101/67   Pulse 86   Ht 6\' 1"  (1.854 m)   Wt 152 lb 4 oz (69.1 kg)   BMI 20.09 kg/m   Constitutional:  Alert and oriented, No acute distress. HEENT: [place] AT, moist mucus membranes.  Trachea midline, no masses. Cardiovascular: No clubbing, cyanosis, or edema. Respiratory: Normal respiratory effort, no increased work of breathing. GI: Abdomen is soft, nontender, nondistended, no abdominal masses GU: No CVA tenderness.  Lymph: No cervical or inguinal lymphadenopathy. Skin: No rashes, bruises or suspicious lesions. Neurologic: Grossly intact, no focal deficits, moving all 4 extremities. Psychiatric: Normal mood and affect.  Laboratory Data: Lab Results  Component Value Date   WBC 14.1 (H) 02/27/2016   HGB 15.3 07/26/2016   HCT 44.1 02/27/2016   MCV 88 02/27/2016   PLT 202 02/27/2016    Lab Results  Component Value Date   CREATININE 0.76 04/29/2016    No results found for: PSA  No results found for: TESTOSTERONE  No results found for: HGBA1C  Urinalysis    Component Value Date/Time   COLORURINE YELLOW 02/28/2012 2250   APPEARANCEUR Clear 12/17/2020 1355   LABSPEC <1.005 (L) 02/28/2012 2250   PHURINE 6.0 02/28/2012 2250   GLUCOSEU Negative 12/17/2020 1355   HGBUR NEGATIVE 02/28/2012 2250   BILIRUBINUR Negative 12/17/2020 1355   KETONESUR NEGATIVE 02/28/2012 2250   PROTEINUR Negative 12/17/2020 1355   PROTEINUR NEGATIVE 02/28/2012 2250   UROBILINOGEN negative 12/19/2015 1159   UROBILINOGEN 0.2 02/28/2012 2250   NITRITE Negative 12/17/2020 1355   NITRITE NEGATIVE 02/28/2012 2250   LEUKOCYTESUR Negative 12/17/2020 1355    Lab Results  Component Value Date   LABMICR Comment 10/01/2020   MUCUS neg 12/19/2015   BACTERIA neg 12/19/2015    Pertinent Imaging:  Results for orders placed in visit on 01/17/14  DG Abd 1 View  Narrative CLINICAL DATA:  Abdominal pain  EXAM: ABDOMEN - 1 VIEW  COMPARISON:  CT, 02/1912  FINDINGS: The  bowel gas pattern is normal. No radio-opaque calculi or other significant radiographic abnormality are seen.  IMPRESSION: Negative.   Electronically Signed By: 03/1912 M.D. On: 01/17/2014 13:39  No results found for this or any previous visit.  No results found for this or any previous visit.  No results found for this or any previous visit.  No results found for this or any previous visit.  No results found for this or any previous visit.  No results found for this or any previous visit.  No results found for this or any previous visit.   Assessment & Plan:    1.  Pain in left testicle -valium prn - Urinalysis, Routine w reflex microscopic  2. Chronic prostatitis without hematuria -restart flomax 0.4mg  daily   No follow-ups on file.  Wilkie Aye, MD  [facility] [place]

## 2021-07-20 NOTE — Telephone Encounter (Signed)
Left a VM:    Patient went to pharmacy to pick up medications. He did not have all medications available at visit that was prescribed.  Please advise.  Call back:  [phone]  Thanks, Rosey Bath

## 2021-08-13 ENCOUNTER — Telehealth: Payer: Self-pay

## 2021-08-13 NOTE — Telephone Encounter (Signed)
Patient states he is unable to get refill at the pharmacy. Patient will call pharmacy to see the specific reason. Patient states he has not filled the  2 refills.

## 2021-08-13 NOTE — Telephone Encounter (Signed)
Patient called requesting refill on Valium.

## 2021-09-06 ENCOUNTER — Ambulatory Visit (HOSPITAL_COMMUNITY)
Admission: AD | Admit: 2021-09-06 | Discharge: 2021-09-06 | Disposition: A | Discharge status: Home / Self Care | Payer: Medicare HMO | Attending: Psychiatry | Admitting: Psychiatry

## 2021-09-06 ENCOUNTER — Encounter (HOSPITAL_COMMUNITY): Payer: Self-pay | Admitting: Emergency Medicine

## 2021-09-06 DIAGNOSIS — Z20822 Contact with and (suspected) exposure to covid-19: Secondary | ICD-10-CM | POA: Insufficient documentation

## 2021-09-06 DIAGNOSIS — F319 Bipolar disorder, unspecified: Secondary | ICD-10-CM | POA: Diagnosis present

## 2021-09-06 DIAGNOSIS — F1210 Cannabis abuse, uncomplicated: Secondary | ICD-10-CM | POA: Diagnosis not present

## 2021-09-06 DIAGNOSIS — F314 Bipolar disorder, current episode depressed, severe, without psychotic features: Secondary | ICD-10-CM | POA: Diagnosis present

## 2021-09-06 DIAGNOSIS — R45851 Suicidal ideations: Secondary | ICD-10-CM | POA: Insufficient documentation

## 2021-09-06 DIAGNOSIS — Z7289 Other problems related to lifestyle: Secondary | ICD-10-CM | POA: Insufficient documentation

## 2021-09-06 DIAGNOSIS — F209 Schizophrenia, unspecified: Secondary | ICD-10-CM | POA: Diagnosis not present

## 2021-09-06 DIAGNOSIS — F17210 Nicotine dependence, cigarettes, uncomplicated: Secondary | ICD-10-CM | POA: Insufficient documentation

## 2021-09-06 DIAGNOSIS — F1110 Opioid abuse, uncomplicated: Secondary | ICD-10-CM | POA: Diagnosis not present

## 2021-09-06 DIAGNOSIS — Z79899 Other long term (current) drug therapy: Secondary | ICD-10-CM | POA: Diagnosis not present

## 2021-09-06 DIAGNOSIS — Z9151 Personal history of suicidal behavior: Secondary | ICD-10-CM | POA: Diagnosis not present

## 2021-09-06 DIAGNOSIS — F3130 Bipolar disorder, current episode depressed, mild or moderate severity, unspecified: Secondary | ICD-10-CM

## 2021-09-06 LAB — RESP PANEL BY RT-PCR (FLU A&B, COVID) ARPGX2
Influenza A by PCR: NEGATIVE
Influenza B by PCR: NEGATIVE
SARS Coronavirus 2 by RT PCR: NEGATIVE

## 2021-09-06 MED ORDER — TRAZODONE HCL 50 MG PO TABS: 50.0000 mg | ORAL_TABLET | Freq: Every evening | ORAL | Status: DC | PRN

## 2021-09-06 MED ORDER — ALUM & MAG HYDROXIDE-SIMETH 200-200-20 MG/5ML PO SUSP: 30.0000 mL | ORAL | Status: DC | PRN

## 2021-09-06 MED ORDER — OLANZAPINE 10 MG PO TBDP
10.0000 mg | ORAL_TABLET | Freq: Once | ORAL | Status: DC
  Filled 2021-09-06: qty 1

## 2021-09-06 MED ORDER — MAGNESIUM HYDROXIDE 400 MG/5ML PO SUSP: 30.0000 mL | Freq: Every day | ORAL | Status: DC | PRN

## 2021-09-06 MED ORDER — HYDROXYZINE HCL 25 MG PO TABS
25.0000 mg | ORAL_TABLET | Freq: Three times a day (TID) | ORAL | Status: DC | PRN
Start: 2021-09-06 — End: 2021-09-06

## 2021-09-06 MED ORDER — ACETAMINOPHEN 325 MG PO TABS
650.0000 mg | ORAL_TABLET | Freq: Four times a day (QID) | ORAL | Status: DC | PRN
Start: 2021-09-06 — End: 2021-09-06

## 2021-09-06 NOTE — Group Note (Signed)
LCSW Group Therapy Note  Patient discharged AMA prior to group.  Ambrose Mantle, LCSW 09/06/2021, 1:26 PM

## 2021-09-06 NOTE — Discharge Summary (Signed)
Physician Discharge Summary Note  Patient:  Steven Quinn is an 35 y.o., male MRN:  [id number] DOB:  1986-04-10 Patient phone:  [phone] (home)  Patient address:   [address] [place] [address],  Total Time spent with patient: 15 minutes  Date of Admission:  09/06/2021 Date of Discharge: 09/06/2021  Reason for Admission:  Per assessment note: Steven Quinn is a 35 year old male with psychiatric history of depression, bipolar disorder, schizophrenia, opioid abuse, and cannabis abuse. Patient presented to [facility] voluntarily with complaint of suicidal ideation, worsening depression and auditory hallucination.  Was reported that patient left AMA.    Per counselor assessment note: After completing assessment, Pt agreed to inpatient psychiatric treatment. Later, while waiting for COVID test, Pt told [facility] security that he no longer wanted to be admitted to [facility]. This TTS counselor listened to Pt's concerns, provided information and reassurance, and Pt again agreed to be admitted. Approximately 30 minutes later, I was told by RN that Pt once again wanted to be discharged. Talked with Pt again and he said he was uncomfortable waiting in the assessment room, he was feeling extremely anxious, and wanted to leave. Discussed with Binnie Rail, [facility] if there were any alternatives to waiting in the assessment room for 2 hours. This TTS counselor and  Cecilio Asper, NP talked again with Pt and he reiterated that he was not suicidal, that he had no plan or intent to harm himself, stating he would not do that to his children. He confirmed again he had no plan or intent to harm anyone. Discussed situation with Cecilio Asper, NP who agreed Pt did not meet criteria for involuntary commitment. Explained AMA Discharge Form to Pt and he once again agreed to stay and be admitted to [facility]. After that discussion, I reported off to [facility], LCSW, triage specialist and ended my shift.  Principal Problem: Bipolar I  disorder, most recent episode depressed [facility]) Discharge Diagnoses: Active Problems:   Schizophrenia (HCC)   Bipolar disorder (HCC)   Bipolar 1 disorder, depressed, severe (HCC)   Past Psychiatric History:   Past Medical History:  Past Medical History:  Diagnosis Date   Bipolar 1 disorder (HCC)    no current med.   GERD (gastroesophageal reflux disease)    TUMS as needed   History of gastric ulcer    History of lymphadenopathy    bilateral inguinal s/p  bx left side -- per pt negative  path   Left testicular pain    Manic depression (HCC)    Migraines    Schizophrenia (HCC)    no current med.   Urinary hesitancy    Urinary urgency    Wears dentures    upper   Wears partial dentures    lower    Past Surgical History:  Procedure Laterality Date   APPENDECTOMY  age 65   INGUINAL LYMPH NODE BIOPSY Left 02/04/2016   Procedure: LEFT INGUINAL LYMPH NODE BIOPSY;  Surgeon: Manus Rudd, MD;  Location: [facility];  Service: General;  Laterality: Left;   VARICOCELECTOMY Left 07/26/2016   Procedure: LEFT NEUROLYSIS VARICOCELECTOMY;  Surgeon: Malen Gauze, MD;  Location: [facility];  Service: Urology;  Laterality: Left;  90 MINS   Family History:  Family History  Problem Relation Age of Onset   Fibromyalgia Mother    Diabetes Mother    Other Brother        car accident   Family Psychiatric  History:  Social History:  Social History   Substance and Sexual Activity  Alcohol Use Yes   Comment: occasionally     Social History   Substance and Sexual Activity  Drug Use No    Social History   Socioeconomic History   Marital status: Single    Spouse name: Not on file   Number of children: 3   Years of education: Not on file   Highest education level: Some college, no degree  Occupational History    Comment: disability  Tobacco Use   Smoking status: Every Day    Packs/day: 3.00    Years: 16.00    Pack years: 48.00    Types:  Cigarettes   Smokeless tobacco: Never  Substance and Sexual Activity   Alcohol use: Yes    Comment: occasionally   Drug use: No   Sexual activity: Not on file  Other Topics Concern   Not on file  Social History Narrative   Lives with long time sig other, 3 children   Caffeine- all I drink is Mtn Dew, occas tea   Social Determinants of Health   Financial Resource Strain: Not on file  Food Insecurity: Not on file  Transportation Needs: Not on file  Physical Activity: Not on file  Stress: Not on file  Social Connections: Not on file    Hospital Course:   Physical Findings: AIMS:  , ,  ,  ,    CIWA:    COWS:     Musculoskeletal: Strength & Muscle Tone: within normal limits Gait & Station: normal Patient leans: N/A   Psychiatric Specialty Exam:  Presentation  General Appearance: Appropriate for Environment  Eye Contact:Good  Speech:Clear and Coherent  Speech Volume:Normal  Handedness:Right   Mood and Affect  Mood:Anxious; Depressed  Affect:Congruent   Thought Process  Thought Processes:Coherent  Descriptions of Associations:Intact  Orientation:Full (Time, Place and Person)  Thought Content:WDL  History of Schizophrenia/Schizoaffective disorder:Yes  Duration of Psychotic Symptoms:Greater than six months  Hallucinations:Hallucinations: Auditory; Visual Description of Auditory Hallucinations: hears voices that tells him to hurt his ex girlfriend current partner  Ideas of Reference:None  Suicidal Thoughts:Suicidal Thoughts: Yes, Passive  Homicidal Thoughts:Homicidal Thoughts: No   Sensorium  Memory:Immediate Good; Recent Good; Remote Good  Judgment:Fair  Insight:Good   Executive Functions  Concentration:Good  Attention Span:Good  Recall:Good  Fund of Knowledge:Good  Language:Good   Psychomotor Activity  Psychomotor Activity:Psychomotor Activity: Normal   Assets  Assets:Communication Skills; Desire for Improvement; Housing;  Social Support; Physical Health   Sleep  Sleep:Sleep: Fair Number of Hours of Sleep: 6    Physical Exam: Physical Exam Vitals and nursing note reviewed.  Cardiovascular:     Rate and Rhythm: Normal rate.  Pulmonary:     Effort: Pulmonary effort is normal.  Neurological:     Mental Status: He is alert and oriented to person, place, and time.  Psychiatric:        Mood and Affect: Mood normal.   Review of Systems  All other systems reviewed and are negative. Blood pressure 98/68, pulse 79, resp. rate 12, SpO2 99 %. There is no height or weight on file to calculate BMI.   Social History   Tobacco Use  Smoking Status Every Day   Packs/day: 3.00   Years: 16.00   Pack years: 48.00   Types: Cigarettes  Smokeless Tobacco Never   Tobacco Cessation:  N/A, patient does not currently use tobacco products   Blood Alcohol level:  No results  found for: [facility]  Metabolic Disorder Labs:  No results found for: HGBA1C, MPG No results found for: PROLACTIN No results found for: CHOL, TRIG, HDL, CHOLHDL, VLDL, LDLCALC  See Psychiatric Specialty Exam and Suicide Risk Assessment completed by Attending Physician prior to discharge.  Discharge destination:  Home  Is patient on multiple antipsychotic therapies at discharge:  No   Has Patient had three or more failed trials of antipsychotic monotherapy by history:  No  Recommended Plan for Multiple Antipsychotic Therapies: NA  Discharge Instructions     Diet - low sodium heart healthy   Complete by: As directed    Increase activity slowly   Complete by: As directed       Allergies as of 09/06/2021       Reactions   Darvon [propoxyphene] Nausea And Vomiting   Other Other (See Comments)   Oxycodone-acetaminophen Nausea And Vomiting   Pollen Extract    Tramadol Other (See Comments)   headache        Medication List     STOP taking these medications    buprenorphine 5 MCG/HR Ptwk Commonly known as: BUTRANS   diazepam  10 MG tablet Commonly known as: VALIUM   hydrOXYzine 25 MG tablet Commonly known as: ATARAX/VISTARIL   meloxicam 15 MG tablet Commonly known as: MOBIC   ondansetron 8 MG tablet Commonly known as: ZOFRAN       TAKE these medications      Indication  QUEtiapine 300 MG tablet Commonly known as: SEROQUEL Take 300 mg by mouth at bedtime.  Indication: Schizophrenia   tamsulosin 0.4 MG Caps capsule Commonly known as: FLOMAX Take 1 capsule (0.4 mg total) by mouth daily.  Indication: Chronic Prostate Gland Inflammation   venlafaxine XR 75 MG 24 hr capsule Commonly known as: EFFEXOR-XR Take 75 mg by mouth daily.  Indication: Major Depressive Disorder         Follow-up recommendations:  Activity:  as tolerated Diet:  heart health  Comments: Take all medications as prescribed. Keep all follow-up appointments as scheduled.  Do not consume alcohol or use illegal drugs while on prescription medications. Report any adverse effects from your medications to your primary care provider promptly.  In the event of recurrent symptoms or worsening symptoms, call 911, a crisis hotline, or go to the nearest emergency department for evaluation. Take all medications as prescribed.   Signed: Oneta Rack, NP 09/06/2021, 8:23 PM
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
## 2021-09-06 NOTE — H&P (Addendum)
Psychiatric Admission Assessment Adult  Patient Identification: Steven Quinn MRN:  [id number] Date of Evaluation:  09/06/2021 Chief Complaint:  urgent emergent eval Principal Diagnosis: Bipolar I disorder, most recent episode depressed (HCC) Diagnosis:  Active Problems:   Schizophrenia (HCC)   Bipolar disorder (HCC)  History of Present Illness: Steven Quinn is a 35 year old male with psychiatric history of depression, bipolar disorder, schizophrenia, opioid abuse, and cannabis abuse. Patient presented to [facility] voluntarily with complaint of suicidal ideation, worsening depression and auditory hallucination.   Patient seen face to face and chart reviewed by this provider. On evaluation, patient is alert and oriented, calm and cooperative. He is noted with nasal congestion. He denies coughing, fever, headache, SOB, chest pain. He reports chronic scrotal pain due to chronic prostatitis. He reports that he is followed by urologist in [place], [place]. He rates his pain 9/10 (10 is worst pain). He report that he takes hydrocodone from the street for pain control. He describes his mood as a "roller coaster." Patient endorses depressive symptoms of irritability, isolation, worthlessness, anxiety, raising thoughts, crying spells, hopelessness, poor sleep, and guilt. He reports that he is prescribed Effexor and Seroquel by Valetta Mole, NP but stopped taking these medications after 4 weeks due to the medications "not working." He reports that he "self medicates" with marijuana (he smokes 4 blunts/day) and 4 hydrocodone tablets daily (he is unsure of dosage). He denies all other substance abuse.   Patient identifies ongoing relationship difficulties with his ex partner (the mother of his children) as his main stressor. He reports experiencing worsening depression and suicidal ideation due to his ex-partner "playing emotional games" with him. He report that his children are very important to him and "they are  the reason why I will not commit suicide." He report passive suicidal ideation without intent or plan. He admits to pass history of suicidal attempt of attempting to hang himself and wanting to jump off of a bridge but stopped due to having "seeing flashes of my children's faces." He denies self-harming. He endorses auditory hallucination of "eliminate the problem, hurt the person that broke up my family (referring to his ex's current boyfriend). He denies homicidal ideation, paranoia, and current visual hallucination. He reports that he resides at home alone and feels "very lonely" at times. He identifies his children and parents as his support.      Associated Signs/Symptoms: Depression Symptoms:  depressed mood, fatigue, feelings of worthlessness/guilt, difficulty concentrating, hopelessness, anxiety, disturbed sleep, Duration of Depression Symptoms: Greater than two weeks  (Hypo) Manic Symptoms:   n/a Anxiety Symptoms:  Excessive Worry, Psychotic Symptoms:  Hallucinations: Auditory PTSD Symptoms: NA Total Time spent with patient: 30 minutes  Past Psychiatric History: schizophrenic, bipolar, depression   Is the patient at risk to self? No.  Has the patient been a risk to self in the past 6 months? No.  Has the patient been a risk to self within the distant past? Yes.    Is the patient a risk to others? No.  Has the patient been a risk to others in the past 6 months? No.  Has the patient been a risk to others within the distant past? No.   Prior Inpatient Therapy:   Prior Outpatient Therapy:    Alcohol Screening:   Substance Abuse History in the last 12 months:  Yes.   Consequences of Substance Abuse: NA Previous Psychotropic Medications: Yes  Psychological Evaluations: No  Past Medical History:  Past Medical History:  Diagnosis  Date   Bipolar 1 disorder (HCC)    no current med.   GERD (gastroesophageal reflux disease)    TUMS as needed   History of gastric ulcer     History of lymphadenopathy    bilateral inguinal s/p  bx left side -- per pt negative  path   Left testicular pain    Manic depression (HCC)    Migraines    Schizophrenia (HCC)    no current med.   Urinary hesitancy    Urinary urgency    Wears dentures    upper   Wears partial dentures    lower    Past Surgical History:  Procedure Laterality Date   APPENDECTOMY  age 36   INGUINAL LYMPH NODE BIOPSY Left 02/04/2016   Procedure: LEFT INGUINAL LYMPH NODE BIOPSY;  Surgeon: Manus Rudd, MD;  Location: [facility];  Service: General;  Laterality: Left;   VARICOCELECTOMY Left 07/26/2016   Procedure: LEFT NEUROLYSIS VARICOCELECTOMY;  Surgeon: Malen Gauze, MD;  Location: [facility];  Service: Urology;  Laterality: Left;  90 MINS   Family History:  Family History  Problem Relation Age of Onset   Fibromyalgia Mother    Diabetes Mother    Other Brother        car accident   Family Psychiatric  History:  Tobacco Screening:   Social History:  Social History   Substance and Sexual Activity  Alcohol Use Yes   Comment: occasionally     Social History   Substance and Sexual Activity  Drug Use No    Additional Social History: Marital status: Single Does patient have children?: Yes How many children?: 3 How is patient's relationship with their children?: Two biological children: Son (6), Daughter (14)    Pain Medications: Pt reports using hydrocodone Prescriptions: Denies abuse Over the Counter: Denies abuse History of alcohol / drug use?: Yes Longest period of sobriety (when/how long): Unknown Negative Consequences of Use: Legal, Financial Withdrawal Symptoms:  (Pt denies) Name of Substance 1: Marijuana 1 - Age of First Use: Adolescent 1 - Amount (size/oz): Approxmately 4 blunts 1 - Frequency: Daily 1 - Duration: Ongoing 1 - Last Use / Amount: 09/05/2021 1 - Method of Aquiring: Unknown 1- Route of Use: Smoking Name of Substance 2:  Hydrocodone (not prescribed) 2 - Age of First Use: Unknown, Pt reports he has used pain medications for years 2 - Amount (size/oz): 4 tabs 2 - Frequency: Daily 2 - Duration: Ongoing 2 - Last Use / Amount: 09/05/2021 2 - Method of Aquiring: Unknown 2 - Route of Substance Use: Oral ingestion                Allergies:   Allergies  Allergen Reactions   Darvon [Propoxyphene] Nausea And Vomiting   Other Other (See Comments)   Oxycodone-Acetaminophen Nausea And Vomiting   Pollen Extract    Tramadol Other (See Comments)    headache   Lab Results:  Results for orders placed or performed during the hospital encounter of 09/06/21 (from the past 48 hour(s))  Resp Panel by RT-PCR (Flu A&B, Covid) Nasopharyngeal Swab     Status: None   Collection Time: 09/06/21  6:28 AM   Specimen: Nasopharyngeal Swab; Nasopharyngeal(NP) swabs in vial transport medium  Result Value Ref Range   SARS Coronavirus 2 by RT PCR NEGATIVE NEGATIVE    Comment: (NOTE) SARS-CoV-2 target nucleic acids are NOT DETECTED.  The SARS-CoV-2 RNA is generally detectable in upper respiratory specimens during  the acute phase of infection. The lowest concentration of SARS-CoV-2 viral copies this assay can detect is 138 copies/mL. A negative result does not preclude SARS-Cov-2 infection and should not be used as the sole basis for treatment or other patient management decisions. A negative result may occur with  improper specimen collection/handling, submission of specimen other than nasopharyngeal swab, presence of viral mutation(s) within the areas targeted by this assay, and inadequate number of viral copies(<138 copies/mL). A negative result must be combined with clinical observations, patient history, and epidemiological information. The expected result is Negative.  Fact Sheet for Patients:  BloggerCourse.com  Fact Sheet for Healthcare Providers:   SeriousBroker.it  This test is no t yet approved or cleared by the [place] FDA and  has been authorized for detection and/or diagnosis of SARS-CoV-2 by FDA under an Emergency Use Authorization (EUA). This EUA will remain  in effect (meaning this test can be used) for the duration of the COVID-19 declaration under Section 564(b)(1) of the Act, 21 U.S.C.section 360bbb-3(b)(1), unless the authorization is terminated  or revoked sooner.       Influenza A by PCR NEGATIVE NEGATIVE   Influenza B by PCR NEGATIVE NEGATIVE    Comment: (NOTE) The Xpert Xpress SARS-CoV-2/FLU/RSV plus assay is intended as an aid in the diagnosis of influenza from Nasopharyngeal swab specimens and should not be used as a sole basis for treatment. Nasal washings and aspirates are unacceptable for Xpert Xpress SARS-CoV-2/FLU/RSV testing.  Fact Sheet for Patients: BloggerCourse.com  Fact Sheet for Healthcare Providers: SeriousBroker.it  This test is not yet approved or cleared by the [place] FDA and has been authorized for detection and/or diagnosis of SARS-CoV-2 by FDA under an Emergency Use Authorization (EUA). This EUA will remain in effect (meaning this test can be used) for the duration of the COVID-19 declaration under Section 564(b)(1) of the Act, 21 U.S.C. section 360bbb-3(b)(1), unless the authorization is terminated or revoked.  Performed at [facility], [address], [place], [place] [address]     Blood Alcohol level:  No results found for: [facility]  Metabolic Disorder Labs:  No results found for: HGBA1C, MPG No results found for: PROLACTIN No results found for: CHOL, TRIG, HDL, CHOLHDL, VLDL, LDLCALC  Current Medications: Current Facility-Administered Medications  Medication Dose Route Frequency Provider Last Rate Last Admin   OLANZapine zydis (ZYPREXA) disintegrating tablet 10 mg  10  mg Oral Once Ajibola, Ene A, NP       PTA Medications: Medications Prior to Admission  Medication Sig Dispense Refill Last Dose   buprenorphine (BUTRANS) 5 MCG/HR PTWK Butrans 5 mcg/hour transdermal patch  Apply 1 patch every week by transdermal route. (Patient not taking: Reported on 07/20/2021)      cyclobenzaprine (FLEXERIL) 10 MG tablet Take 10 mg by mouth 3 (three) times daily. (Patient not taking: Reported on 07/20/2021)      diazepam (VALIUM) 10 MG tablet Take 1 tablet (10 mg total) by mouth daily as needed for anxiety. 30 tablet 2    hydrOXYzine (ATARAX/VISTARIL) 25 MG tablet Take 25 mg by mouth every 8 (eight) hours as needed. (Patient not taking: Reported on 07/20/2021)      meloxicam (MOBIC) 15 MG tablet 1 tablet (Patient not taking: Reported on 07/20/2021)      ondansetron (ZOFRAN) 8 MG tablet 1 tablet as needed for nausea (Patient not taking: Reported on 07/20/2021)      oxyCODONE-acetaminophen (PERCOCET) 10-325 MG tablet Take 1 tablet by mouth every 8 (  eight) hours as needed for pain. (Patient not taking: Reported on 07/20/2021) 30 tablet 0    pregabalin (LYRICA) 150 MG capsule Take 150 mg by mouth 2 (two) times daily. (Patient not taking: Reported on 07/20/2021)      QUEtiapine (SEROQUEL) 300 MG tablet Take 300 mg by mouth at bedtime.      tamsulosin (FLOMAX) 0.4 MG CAPS capsule Take 1 capsule (0.4 mg total) by mouth daily. 30 capsule 11    venlafaxine XR (EFFEXOR-XR) 75 MG 24 hr capsule Take 75 mg by mouth daily.       Musculoskeletal: Strength & Muscle Tone: within normal limits Gait & Station: normal Patient leans: Right            Psychiatric Specialty Exam:  Presentation  General Appearance: Appropriate for Environment  Eye Contact:Good  Speech:Clear and Coherent  Speech Volume:Normal  Handedness:Right   Mood and Affect  Mood:Anxious; Depressed  Affect:Congruent   Thought Process  Thought Processes:Coherent  Duration of Psychotic Symptoms: Greater than  six months  Past Diagnosis of Schizophrenia or Psychoactive disorder: Yes  Descriptions of Associations:Intact  Orientation:Full (Time, Place and Person)  Thought Content:WDL  Hallucinations:Hallucinations: Auditory; Visual Description of Auditory Hallucinations: hears voices that tells him to hurt his ex girlfriend current partner  Ideas of Reference:None  Suicidal Thoughts:Suicidal Thoughts: Yes, Passive  Homicidal Thoughts:Homicidal Thoughts: No   Sensorium  Memory:Immediate Good; Recent Good; Remote Good  Judgment:Fair  Insight:Good   Executive Functions  Concentration:Good  Attention Span:Good  Recall:Good  Fund of Knowledge:Good  Language:Good   Psychomotor Activity  Psychomotor Activity:Psychomotor Activity: Normal   Assets  Assets:Communication Skills; Desire for Improvement; Housing; Social Support; Physical Health   Sleep  Sleep:Sleep: Fair Number of Hours of Sleep: 6    Physical Exam: Physical Exam Vitals reviewed.  Constitutional:      General: He is not in acute distress.    Appearance: He is not ill-appearing.  HENT:     Head: Normocephalic and atraumatic.     Nose: Rhinorrhea present. No congestion.  Eyes:     General:        Right eye: No discharge.        Left eye: No discharge.  Cardiovascular:     Rate and Rhythm: Normal rate.  Pulmonary:     Effort: Pulmonary effort is normal. No respiratory distress.     Breath sounds: Normal breath sounds. No wheezing.  Chest:     Chest wall: No tenderness.  Abdominal:     General: Bowel sounds are normal. There is no distension.  Musculoskeletal:        General: Normal range of motion.     Cervical back: Normal range of motion.  Skin:    General: Skin is warm.  Neurological:     Mental Status: He is alert and oriented to person, place, and time.  Psychiatric:        Attention and Perception: Attention normal. He is attentive. He does not perceive auditory or visual  hallucinations.        Mood and Affect: Mood is anxious and depressed.        Speech: Speech normal.        Behavior: Behavior normal. Behavior is cooperative.        Thought Content: Thought content is not paranoid or delusional. Thought content includes suicidal ideation. Thought content does not include homicidal ideation. Thought content does not include homicidal or suicidal plan.        Cognition  and Memory: Cognition normal.   Review of Systems  Constitutional: Negative.   HENT: Negative.         Nasal congestion  Eyes: Negative.   Respiratory:  Negative for cough, hemoptysis, shortness of breath and wheezing.   Cardiovascular: Negative.   Gastrointestinal: Negative.   Genitourinary:  Positive for frequency.       Chronic scrotal pain   Musculoskeletal: Negative.   Skin: Negative.   Neurological: Negative.   Endo/Heme/Allergies: Negative.   Psychiatric/Behavioral:  Positive for depression, hallucinations, substance abuse and suicidal ideas. The patient is nervous/anxious.   Blood pressure 98/68, pulse 79, resp. rate 12, SpO2 99 %. There is no height or weight on file to calculate BMI.  Treatment Plan Summary: Daily contact with patient to assess and evaluate symptoms and progress in treatment and Medication management  Observation Level/Precautions:  15 minute checks  Laboratory:  CBC Chemistry Profile HbAIC UDS UA  Psychotherapy:    Medications:    Consultations:    Discharge Concerns:    Estimated LOS:  Other:     Physician Treatment Plan for Primary Diagnosis: Bipolar I disorder, most recent episode depressed (HCC) Long Term Goal(s): Improvement in symptoms so as ready for discharge  Short Term Goals: Ability to identify changes in lifestyle to reduce recurrence of condition will improve, Ability to verbalize feelings will improve, Ability to disclose and discuss suicidal ideas, Ability to demonstrate self-control will improve, Ability to identify and develop  effective coping behaviors will improve, Ability to maintain clinical measurements within normal limits will improve, Compliance with prescribed medications will improve, and Ability to identify triggers associated with substance abuse/mental health issues will improve  Physician Treatment Plan for Secondary Diagnosis: Active Problems:   Schizophrenia (HCC)   Bipolar disorder (HCC)  Long Term Goal(s): Improvement in symptoms so as ready for discharge  Short Term Goals: Ability to identify changes in lifestyle to reduce recurrence of condition will improve, Ability to verbalize feelings will improve, Ability to disclose and discuss suicidal ideas, Ability to demonstrate self-control will improve, Ability to identify and develop effective coping behaviors will improve, Ability to maintain clinical measurements within normal limits will improve, Compliance with prescribed medications will improve, and Ability to identify triggers associated with substance abuse/mental health issues will improve  I certify that inpatient services furnished can reasonably be expected to improve the patient's condition.    Maricela Bo, NP 9/25/20227:37 AM Patient stated he does not want to be admitted, can contract for safety of self, had no intentions of hurting anyone as he wants to be there for his children. Patient also has outpatient follow up with Valetta Mole, NP . Patient left AMA and did not go to the unit. This information was obtained by conversation with NP. Also involved was [facility] and TTS counselor Steven Quinn

## 2021-09-07 NOTE — BH Assessment (Signed)
After completing assessment, Pt agreed to inpatient psychiatric treatment. Later, while waiting for COVID test, Pt told [facility] security that he no longer wanted to be admitted to [facility]. This TTS counselor listened to Pt's concerns, provided information and reassurance, and Pt again agreed to be admitted. Approximately 30 minutes later, I was told by RN that Pt once again wanted to be discharged. Talked with Pt again and he said he was uncomfortable waiting in the assessment room, he was feeling extremely anxious, and wanted to leave. Discussed with Binnie Rail, [facility] if there were any alternatives to waiting in the assessment room for 2 hours. This TTS counselor and  Cecilio Asper, NP talked again with Pt and he reiterated that he was not suicidal, that he had no plan or intent to harm himself, stating he would not do that to his children. He confirmed again he had no plan or intent to harm anyone. Discussed situation with Cecilio Asper, NP who agreed Pt did not meet criteria for involuntary commitment. Explained AMA Discharge Form to Pt and he once again agreed to stay and be admitted to [facility]. After that discussion, I reported off to [facility], LCSW, triage specialist and ended my shift.   Pamalee Leyden, [facility] Triage Specialist [phone]

## 2021-09-07 NOTE — H&P (Signed)
Behavioral Health Medical Screening Exam  Steven Quinn is an 35 y.o. male with psychiatric history of depression, bipolar disorder, schizophrenia, opioid abuse, and cannabis abuse. Patient presented to [facility] voluntarily with complaint of suicidal ideation, worsening depression and auditory hallucination.    Patient seen face to face and chart reviewed by this provider. On evaluation, patient is alert and oriented, calm and cooperative. He is noted with nasal congestion. He denies coughing, fever, headache, SOB, chest pain. He reports chronic scrotal pain due to chronic prostatitis. He reports that he is followed by urologist in [place], [place]. He rates his pain 9/10 (10 is worst pain). He report that he takes "hydrocodone from the streets" for pain control. He describes his mood as a "roller coaster." Patient endorses depressive symptoms of irritability, isolation, worthlessness, anxiety, racing thoughts, crying spells, hopelessness, poor sleep, and guilt. He reports that he is prescribed Effexor and Seroquel by Valetta Mole, NP but stopped taking these medications after 4 weeks due to the medications "not working." He reports that he "self medicates" with marijuana (he smokes 4 blunts/day) and 4 hydrocodone tablets daily (he is unsure of dosage). He denies all other substance abuse.    Patient identifies ongoing relationship difficulties with his ex partner (the mother of his children) as his main stressor. He reports experiencing worsening depression and suicidal ideation due to his ex-partner "playing emotional games" with him. He report that his children are very important to him and "they are the reason why I will not commit suicide." He report passive suicidal ideation without intent or plan. He admits to pass history of suicidal attempt of attempting to hang himself and wanting to jump off of a bridge but stopped due to "seeing flashes of my children's faces." He denies self-harming. He endorses  auditory hallucination of "eliminate the problem, hurt the person that broke up my family (referring to his ex's current boyfriend). He denies homicidal ideation, paranoia, and current visual hallucination. He reports that he resides at home alone and feels "very lonely" at times. He identifies his children and parents as his support.   Discuss options for treatment with patient. Patient was recommended for inpatient psychiatric admission. Patient was initially agreeable to inpatient admission but later changed his mind. This provider, Venda Rodes, [facility], and [facility] Mardene Celeste, RN attempted to redirect patient and discuss benefits of inpatient psychiatric treatment but patient continues to refuse inpatient admission and report he would rather follow up with his provider Valetta Mole, NP to discuss medication changes. Patient is able to contract for safety. He denies SI/HI/paranoia. Patient left AMA. He doesn't meet criteria for IVC.     Total Time spent with patient: 45 minutes  Psychiatric Specialty Exam: Physical Exam Constitutional:      General: He is not in acute distress.    Appearance: He is not ill-appearing, toxic-appearing or diaphoretic.  HENT:     Head: Normocephalic.     Nose: Congestion and rhinorrhea present.  Eyes:     General:        Right eye: No discharge.        Left eye: No discharge.     Conjunctiva/sclera: Conjunctivae normal.  Cardiovascular:     Rate and Rhythm: Normal rate.  Pulmonary:     Effort: Pulmonary effort is normal. No respiratory distress.  Genitourinary:    Comments: Chronic scrotal pain. Pain rate 9/10. Patient has chronic prostatitis, he is followed by urologist.  Musculoskeletal:        General:  Normal range of motion.     Cervical back: Normal range of motion.  Neurological:     Mental Status: He is alert and oriented to person, place, and time.  Psychiatric:        Attention and Perception: He perceives auditory hallucinations.        Mood  and Affect: Mood is anxious and depressed.        Speech: Speech normal.        Behavior: Behavior is cooperative.        Thought Content: Thought content normal. Thought content is not paranoid or delusional. Thought content does not include homicidal or suicidal ideation. Thought content does not include homicidal or suicidal plan.        Cognition and Memory: Cognition normal.   Review of Systems  Constitutional:  Negative for chills, fatigue and fever.  HENT:  Positive for rhinorrhea. Negative for ear pain, sinus pressure, sneezing and sore throat.   Eyes:  Negative for pain and visual disturbance.  Respiratory:  Negative for cough and shortness of breath.   Cardiovascular:  Negative for chest pain and palpitations.  Gastrointestinal:  Negative for abdominal pain and vomiting.  Genitourinary:  Positive for testicular pain. Negative for dysuria and hematuria.  Musculoskeletal:  Negative for arthralgias and back pain.  Skin:  Negative for color change and rash.  Neurological:  Negative for seizures and syncope.  Psychiatric/Behavioral:  Positive for sleep disturbance. Negative for suicidal ideas. The patient is nervous/anxious.   All other systems reviewed and are negative. Blood pressure 98/68, pulse 79, resp. rate 12, SpO2 99 %.There is no height or weight on file to calculate BMI. General Appearance: Casual Eye Contact:  Good Speech:  Clear and Coherent Volume:  Normal Mood:  Depressed Affect:  Congruent Thought Process:  Coherent Orientation:  Full (Time, Place, and Person) Thought Content:  WDL and Logical Suicidal Thoughts:  No Homicidal Thoughts:  No Memory:  Immediate;   Good Recent;   Good Remote;   Good Judgement:  Fair Insight:  Good Psychomotor Activity:  Normal Concentration: Concentration: Good and Attention Span: Good Recall:  Good Fund of Knowledge:Good Language: Good Akathisia:  NA Handed:  Right AIMS (if indicated):    Assets:  Desire for  Improvement Housing Physical Health Social Support Transportation Sleep:     Musculoskeletal: Strength & Muscle Tone: within normal limits Gait & Station: normal Patient leans: Right  Blood pressure 98/68, pulse 79, resp. rate 12, SpO2 99 %.  Recommendations: Based on my evaluation the patient does not appear to have an emergency medical condition. Discuss options for treatment with patient. Patient was recommended for inpatient psychiatric admission. Patient was initially agreeable to inpatient admission but later changed his mind. This provider, Venda Rodes, [facility], and [facility] Mardene Celeste, RN attempted to redirect patient and discuss benefits of inpatient psychiatric treatment but patient continues to refuse inpatient admission and report he would rather follow up with his provider Valetta Mole, NP to discuss medication changes. Patient is able to contract for safety. He denies SI/HI/paranoia. Patient left AMA. He doesn't meet criteria for IVC.   Maricela Bo, NP 09/07/2021, 1:21 AM

## 2021-10-04 ENCOUNTER — Other Ambulatory Visit: Payer: Self-pay | Admitting: Urology

## 2021-10-23 ENCOUNTER — Other Ambulatory Visit: Payer: Self-pay

## 2021-10-23 ENCOUNTER — Ambulatory Visit (INDEPENDENT_AMBULATORY_CARE_PROVIDER_SITE_OTHER): Payer: Medicare HMO | Admitting: Urology

## 2021-10-23 ENCOUNTER — Encounter: Payer: Self-pay | Admitting: Urology

## 2021-10-23 VITALS — BP 126/81 | HR 85 | Temp 97.2°F

## 2021-10-23 DIAGNOSIS — N50812 Left testicular pain: Secondary | ICD-10-CM | POA: Diagnosis not present

## 2021-10-23 DIAGNOSIS — N411 Chronic prostatitis: Secondary | ICD-10-CM

## 2021-10-23 DIAGNOSIS — R3911 Hesitancy of micturition: Secondary | ICD-10-CM

## 2021-10-23 LAB — MICROSCOPIC EXAMINATION
Bacteria, UA: NONE SEEN
Epithelial Cells (non renal): NONE SEEN /hpf (ref 0–10)
RBC, Urine: NONE SEEN /hpf (ref 0–2)
Renal Epithel, UA: NONE SEEN /hpf
WBC, UA: NONE SEEN /hpf (ref 0–5)

## 2021-10-23 LAB — URINALYSIS, ROUTINE W REFLEX MICROSCOPIC
Bilirubin, UA: NEGATIVE
Glucose, UA: NEGATIVE
Ketones, UA: NEGATIVE
Leukocytes,UA: NEGATIVE
Nitrite, UA: NEGATIVE
Protein,UA: NEGATIVE
Specific Gravity, UA: 1.015 (ref 1.005–1.030)
Urobilinogen, Ur: 0.2 mg/dL (ref 0.2–1.0)
pH, UA: 6.5 (ref 5.0–7.5)

## 2021-10-23 MED ORDER — TAMSULOSIN HCL 0.4 MG PO CAPS: 0.4000 mg | ORAL_CAPSULE | Freq: Every day | ORAL | 11 refills | Status: DC

## 2021-10-23 MED ORDER — MELOXICAM 7.5 MG PO TABS: 7.5000 mg | ORAL_TABLET | Freq: Every day | ORAL | 11 refills | Status: DC

## 2021-10-23 MED ORDER — DIAZEPAM 10 MG PO TABS: 10.0000 mg | ORAL_TABLET | Freq: Every day | ORAL | 3 refills | Status: DC | PRN

## 2021-10-23 NOTE — Progress Notes (Signed)
10/23/2021 12:34 PM   Steven Quinn Feb 16, 1986 [id number]  Referring provider: Waldon Reining, MD [address] [place],  [place] [address]  Followup testicular pain and urinary hesitancy    HPI: Mr Litsey is a 35yo here for followup for chronic prostatitis, testicular pain and urinary hesitancy. His urinary hesitancy and straining to urinate has resolved with flomax 0.4mg  daily. He continue to have intermittent flares of his left testicular pain and pelvic pain for which he takes valium prn. Overall he is improving on therapy. No other complaints today   PMH: Past Medical History:  Diagnosis Date   Bipolar 1 disorder (HCC)    no current med.   GERD (gastroesophageal reflux disease)    TUMS as needed   History of gastric ulcer    History of lymphadenopathy    bilateral inguinal s/p  bx left side -- per pt negative  path   Left testicular pain    Manic depression (HCC)    Migraines    Schizophrenia (HCC)    no current med.   Urinary hesitancy    Urinary urgency    Wears dentures    upper   Wears partial dentures    lower    Surgical History: Past Surgical History:  Procedure Laterality Date   APPENDECTOMY  age 28   INGUINAL LYMPH NODE BIOPSY Left 02/04/2016   Procedure: LEFT INGUINAL LYMPH NODE BIOPSY;  Surgeon: Manus Rudd, MD;  Location: [facility];  Service: General;  Laterality: Left;   VARICOCELECTOMY Left 07/26/2016   Procedure: LEFT NEUROLYSIS VARICOCELECTOMY;  Surgeon: Malen Gauze, MD;  Location: [facility];  Service: Urology;  Laterality: Left;  90 MINS    Home Medications:  Allergies as of 10/23/2021       Reactions   Darvon [propoxyphene] Nausea And Vomiting   Other Other (See Comments)   Oxycodone-acetaminophen Nausea And Vomiting   Pollen Extract    Tramadol Other (See Comments)   headache        Medication List        Accurate as of October 23, 2021 12:34 PM. If you have any questions, ask your  nurse or doctor.          diazepam 10 MG tablet Commonly known as: VALIUM TAKE 1 TABLET BY MOUTH DAILY AS NEEDED FOR ANXIETY   QUEtiapine 300 MG tablet Commonly known as: SEROQUEL Take 300 mg by mouth at bedtime.   tamsulosin 0.4 MG Caps capsule Commonly known as: FLOMAX Take 1 capsule (0.4 mg total) by mouth daily.   venlafaxine XR 75 MG 24 hr capsule Commonly known as: EFFEXOR-XR Take 75 mg by mouth daily.        Allergies:  Allergies  Allergen Reactions   Darvon [Propoxyphene] Nausea And Vomiting   Other Other (See Comments)   Oxycodone-Acetaminophen Nausea And Vomiting   Pollen Extract    Tramadol Other (See Comments)    headache    Family History: Family History  Problem Relation Age of Onset   Fibromyalgia Mother    Diabetes Mother    Other Brother        car accident    Social History:  reports that he has been smoking cigarettes. He has a 48.00 pack-year smoking history. He has never used smokeless tobacco. He reports current alcohol use. He reports that he does not use drugs.  ROS: All other review of systems were reviewed and are negative except what is noted above  in HPI  Physical Exam: BP 126/81   Pulse 85   Temp (!) 97.2 F (36.2 C)   Constitutional:  Alert and oriented, No acute distress. HEENT: [place] AT, moist mucus membranes.  Trachea midline, no masses. Cardiovascular: No clubbing, cyanosis, or edema. Respiratory: Normal respiratory effort, no increased work of breathing. GI: Abdomen is soft, nontender, nondistended, no abdominal masses GU: No CVA tenderness.  Lymph: No cervical or inguinal lymphadenopathy. Skin: No rashes, bruises or suspicious lesions. Neurologic: Grossly intact, no focal deficits, moving all 4 extremities. Psychiatric: Normal mood and affect.  Laboratory Data: Lab Results  Component Value Date   WBC 14.1 (H) 02/27/2016   HGB 15.3 07/26/2016   HCT 44.1 02/27/2016   MCV 88 02/27/2016   PLT 202 02/27/2016     Lab Results  Component Value Date   CREATININE 0.76 04/29/2016    No results found for: PSA  No results found for: TESTOSTERONE  No results found for: HGBA1C  Urinalysis    Component Value Date/Time   COLORURINE YELLOW 02/28/2012 2250   APPEARANCEUR Clear 12/17/2020 1355   LABSPEC <1.005 (L) 02/28/2012 2250   PHURINE 6.0 02/28/2012 2250   GLUCOSEU Negative 12/17/2020 1355   HGBUR NEGATIVE 02/28/2012 2250   BILIRUBINUR Negative 12/17/2020 1355   KETONESUR NEGATIVE 02/28/2012 2250   PROTEINUR Negative 12/17/2020 1355   PROTEINUR NEGATIVE 02/28/2012 2250   UROBILINOGEN negative 12/19/2015 1159   UROBILINOGEN 0.2 02/28/2012 2250   NITRITE Negative 12/17/2020 1355   NITRITE NEGATIVE 02/28/2012 2250   LEUKOCYTESUR Negative 12/17/2020 1355    Lab Results  Component Value Date   LABMICR Comment 10/01/2020   MUCUS neg 12/19/2015   BACTERIA neg 12/19/2015    Pertinent Imaging:  Results for orders placed in visit on 01/17/14  DG Abd 1 View  Narrative CLINICAL DATA:  Abdominal pain  EXAM: ABDOMEN - 1 VIEW  COMPARISON:  CT, 02/1912  FINDINGS: The bowel gas pattern is normal. No radio-opaque calculi or other significant radiographic abnormality are seen.  IMPRESSION: Negative.   Electronically Signed By: Lajean Manes M.D. On: 01/17/2014 13:39  No results found for this or any previous visit.  No results found for this or any previous visit.  No results found for this or any previous visit.  No results found for this or any previous visit.  No results found for this or any previous visit.  No results found for this or any previous visit.  No results found for this or any previous visit.   Assessment & Plan:    1. Pain in left testicle -valium 10mg  daily prn -We will trial meloxicam 7.5mg  daily  2. Chronic prostatitis without hematuria -continue flomax 0.4mg  daily  3. Urinary hesitancy -continue flomax 0.4mg  daily   No follow-ups on  file.  Nicolette Bang, MD  [facility] [place]

## 2021-10-23 NOTE — Patient Instructions (Signed)
Prostatitis Prostatitis is swelling of the prostate gland, also called the prostate. This gland is about 1.5 inches wide and 1 inch high, and it helps to make a fluid called semen. The prostate is below a man's bladder, in front of the butt (rectum). There are different types of prostatitis. What are the causes? One type of prostatitis is caused by an infection from germs (bacteria). Another type is not caused by germs. It may be caused by: Things having to do with the nervous system. This system includes thebrain, spinal cord, and nerves. An autoimmune response. This happens when the body's disease-fighting system attacks healthy tissue in the body by mistake. Psychological factors. These have to do with how the mind works. The causes of other types of prostatitis are normally not known. What are the signs or symptoms? Symptoms of this condition depend on the type of prostatitis you have. If your condition is caused by germs: You may feel pain or burning when you pee (urinate). You may pee often and all of a sudden. You may have problems starting to pee. You may have trouble emptying your bladder when you pee. You may have fever or chills. You may feel pain in your muscles, joints, low back, or lower belly. If you have other types of prostatitis: You may pee often or all of a sudden. You may have trouble starting to pee. You may have a weak flow when you pee. You may leak pee after using the bathroom. You may have other problems, such as: Abnormal fluid coming from the penis. Pain in the testicles or penis. Pain between the butt and the testicles. Pain when fluid comes out of the penis during sex. How is this treated? Treatment for this condition depends on the type of prostatitis. Treatment may include: Medicines. These may treat pain or swelling, or they may help relax muscles. Exercises to help you move better or get stronger (physical therapy). Heat therapy. Techniques to help  you control some of the ways that your body works. Exercises to help you relax. Antibiotic medicine, if your condition is caused by germs. Warm water baths (sitz baths) to relax muscles. Follow these instructions at home: Medicines Take over-the-counter and prescription medicines only as told by your doctor. If you were prescribed an antibiotic medicine, take it as told by your doctor. Do not stop using the antibiotic even if you start to feel better. Managing pain and swelling  Take sitz baths as told by your doctor. For a sitz bath, sit in warm water that is deep enough to cover your hips and butt. If told, put heat on the painful area. Do this as often as told by your doctor. Use the heat source that your doctor recommends, such as a moist heat pack or a heating pad. Place a towel between your skin and the heat source. Leave the heat on for 20-30 minutes. Take off the heat if your skin turns bright red. This is very important if you are unable to feel pain, heat, or cold. You may have a greater risk of getting burned. General instructions Do exercises as told by your doctor, if your doctor prescribed them. Keep all follow-up visits as told by your doctor. This is important. Where to find more information National Institute of Diabetes and Digestive and Kidney Diseases: https://www.niddk.nih.gov Contact a doctor if: Your symptoms get worse. You have a fever. Get help right away if: You have chills. You feel light-headed. You feel like you may faint.   You cannot pee. You have blood or clumps of blood (blood clots) in your pee. Summary Prostatitis is swelling of the prostate gland. There are different types of prostatitis. Treatment depends on the type that you have. Take over-the-counter and prescription medicines only as told by your doctor. Get help right away of you have chills, feel light-headed, or feel like you may faint. Also get help right away if you cannot pee or you have  blood or clumps of blood in your pee. This information is not intended to replace advice given to you by your health care provider. Make sure you discuss any questions you have with your health care provider. Document Revised: 01/04/2020 Document Reviewed: 01/04/2020 Elsevier Patient Education  2022 [employer] Inc[employer]  

## 2021-10-23 NOTE — Progress Notes (Signed)
Urological Symptom Review  Patient is experiencing the following symptoms: Burning/pain with urination Trouble starting stream   Review of Systems  Gastrointestinal (upper)  : Negative for upper GI symptoms  Gastrointestinal (lower) : Negative for lower GI symptoms  Constitutional : Negative for symptoms  Skin: Negative for skin symptoms  Eyes: Negative for eye symptoms  Ear/Nose/Throat : Negative for Ear/Nose/Throat symptoms  Hematologic/Lymphatic: Negative for Hematologic/Lymphatic symptoms  Cardiovascular : Chest pain  Respiratory : Negative for respiratory symptoms  Endocrine: Negative for endocrine symptoms  Musculoskeletal: Negative for musculoskeletal symptoms  Neurological: Negative for neurological symptoms  Psychologic: Negative for psychiatric symptoms

## 2021-11-26 ENCOUNTER — Encounter: Payer: Self-pay | Admitting: Urology

## 2021-11-26 NOTE — Telephone Encounter (Signed)
See below. Please advise.  

## 2021-12-29 DIAGNOSIS — F17200 Nicotine dependence, unspecified, uncomplicated: Secondary | ICD-10-CM | POA: Diagnosis not present

## 2021-12-29 DIAGNOSIS — F411 Generalized anxiety disorder: Secondary | ICD-10-CM | POA: Diagnosis not present

## 2021-12-29 DIAGNOSIS — R102 Pelvic and perineal pain: Secondary | ICD-10-CM | POA: Diagnosis not present

## 2021-12-29 DIAGNOSIS — K219 Gastro-esophageal reflux disease without esophagitis: Secondary | ICD-10-CM | POA: Diagnosis not present

## 2021-12-29 DIAGNOSIS — Z202 Contact with and (suspected) exposure to infections with a predominantly sexual mode of transmission: Secondary | ICD-10-CM | POA: Diagnosis not present

## 2021-12-29 DIAGNOSIS — B351 Tinea unguium: Secondary | ICD-10-CM | POA: Diagnosis not present

## 2021-12-29 DIAGNOSIS — Z2821 Immunization not carried out because of patient refusal: Secondary | ICD-10-CM | POA: Diagnosis not present

## 2022-01-07 DIAGNOSIS — F333 Major depressive disorder, recurrent, severe with psychotic symptoms: Secondary | ICD-10-CM | POA: Diagnosis not present

## 2022-01-07 DIAGNOSIS — F439 Reaction to severe stress, unspecified: Secondary | ICD-10-CM | POA: Diagnosis not present

## 2022-01-07 DIAGNOSIS — F312 Bipolar disorder, current episode manic severe with psychotic features: Secondary | ICD-10-CM | POA: Diagnosis not present

## 2022-01-07 DIAGNOSIS — F319 Bipolar disorder, unspecified: Secondary | ICD-10-CM | POA: Diagnosis not present

## 2022-01-14 DIAGNOSIS — F333 Major depressive disorder, recurrent, severe with psychotic symptoms: Secondary | ICD-10-CM | POA: Diagnosis not present

## 2022-01-14 DIAGNOSIS — F319 Bipolar disorder, unspecified: Secondary | ICD-10-CM | POA: Diagnosis not present

## 2022-01-21 DIAGNOSIS — F319 Bipolar disorder, unspecified: Secondary | ICD-10-CM | POA: Diagnosis not present

## 2022-01-21 DIAGNOSIS — Z79899 Other long term (current) drug therapy: Secondary | ICD-10-CM | POA: Diagnosis not present

## 2022-01-21 DIAGNOSIS — F333 Major depressive disorder, recurrent, severe with psychotic symptoms: Secondary | ICD-10-CM | POA: Diagnosis not present

## 2022-01-22 ENCOUNTER — Other Ambulatory Visit: Payer: Self-pay

## 2022-01-22 ENCOUNTER — Encounter: Payer: Self-pay | Admitting: Urology

## 2022-01-22 ENCOUNTER — Ambulatory Visit (INDEPENDENT_AMBULATORY_CARE_PROVIDER_SITE_OTHER): Payer: Medicare HMO | Admitting: Urology

## 2022-01-22 VITALS — BP 114/77 | HR 114

## 2022-01-22 DIAGNOSIS — N411 Chronic prostatitis: Secondary | ICD-10-CM | POA: Diagnosis not present

## 2022-01-22 DIAGNOSIS — R3911 Hesitancy of micturition: Secondary | ICD-10-CM

## 2022-01-22 DIAGNOSIS — N50812 Left testicular pain: Secondary | ICD-10-CM | POA: Diagnosis not present

## 2022-01-22 MED ORDER — MELOXICAM 7.5 MG PO TABS: 7.5000 mg | ORAL_TABLET | Freq: Every day | ORAL | 11 refills | Status: AC

## 2022-01-22 MED ORDER — TAMSULOSIN HCL 0.4 MG PO CAPS: 0.4000 mg | ORAL_CAPSULE | Freq: Every day | ORAL | 11 refills | Status: AC

## 2022-01-22 MED ORDER — OXYCODONE-ACETAMINOPHEN 10-325 MG PO TABS: 1.0000 | ORAL_TABLET | Freq: Every day | ORAL | 0 refills | Status: AC | PRN

## 2022-01-22 NOTE — Progress Notes (Signed)
01/22/2022 11:15 AM   Steven Quinn 10/19/1986 [id number]  Referring provider: No referring provider defined for this encounter.  Followup scrotal pain and urinary hesitancy  HPI: Steven Quinn is a 36yo here for followup for scrotal/testis pain and urinary hesitancy. He has been urinating well with flomax 0.4mg  daily. IPSS 4 QOl 1. He was previously using valium prn for his testis pain but has stopped it since it interacts with his seroquel. His testis pain is sharp, intermittent, mild to moderate and nonraditing. It is not exacerbated by activity. No other associated symptoms   PMH: Past Medical History:  Diagnosis Date   Bipolar 1 disorder ([place])    no current med.   GERD (gastroesophageal reflux disease)    TUMS as needed   History of gastric ulcer    History of lymphadenopathy    bilateral inguinal s/p  bx left side -- per pt negative  path   Left testicular pain    Manic depression ([place])    Migraines    Schizophrenia ([place])    no current med.   Urinary hesitancy    Urinary urgency    Wears dentures    upper   Wears partial dentures    lower    Surgical History: Past Surgical History:  Procedure Laterality Date   APPENDECTOMY  age 15   INGUINAL LYMPH NODE BIOPSY Left 02/04/2016   Procedure: LEFT INGUINAL LYMPH NODE BIOPSY;  Surgeon: Donnie Mesa, MD;  Location: [place];  Service: General;  Laterality: Left;   VARICOCELECTOMY Left 07/26/2016   Procedure: LEFT NEUROLYSIS VARICOCELECTOMY;  Surgeon: Cleon Gustin, MD;  Location[facility];  Service: Urology;  Laterality: Left;  90 MINS    Home Medications:  Allergies as of 01/22/2022       Reactions   Darvon [propoxyphene] Nausea And Vomiting   Other Other (See Comments)   Oxycodone-acetaminophen Nausea And Vomiting   Pollen Extract    Tramadol Other (See Comments)   headache        Medication List        Accurate as of January 22, 2022 11:15 AM. If you have any  questions, ask your nurse or doctor.          ciclopirox 8 % solution Commonly known as: PENLAC Apply topically.   diazepam 10 MG tablet Commonly known as: VALIUM Take 1 tablet (10 mg total) by mouth daily as needed. for anxiety   meloxicam 7.5 MG tablet Commonly known as: Mobic Take 1 tablet (7.5 mg total) by mouth daily.   omeprazole 40 MG capsule Commonly known as: PRILOSEC Take by mouth.   oxyCODONE-acetaminophen 7.5-325 MG tablet Commonly known as: PERCOCET Take by mouth.   oxyCODONE-acetaminophen 5-325 MG tablet Commonly known as: PERCOCET/ROXICET Take by mouth.   QUEtiapine 300 MG tablet Commonly known as: SEROQUEL Take 300 mg by mouth at bedtime.   tamsulosin 0.4 MG Caps capsule Commonly known as: FLOMAX Take 1 capsule (0.4 mg total) by mouth daily.   venlafaxine XR 75 MG 24 hr capsule Commonly known as: EFFEXOR-XR Take 75 mg by mouth daily.        Allergies:  Allergies  Allergen Reactions   Darvon [Propoxyphene] Nausea And Vomiting   Other Other (See Comments)   Oxycodone-Acetaminophen Nausea And Vomiting   Pollen Extract    Tramadol Other (See Comments)    headache    Family History: Family History  Problem Relation Age of Onset   Fibromyalgia Mother  Diabetes Mother    Other Brother        car accident    Social History:  reports that he has been smoking cigarettes. He has a 48.00 pack-year smoking history. He has never used smokeless tobacco. He reports current alcohol use. He reports that he does not use drugs.  ROS: All other review of systems were reviewed and are negative except what is noted above in HPI  Physical Exam: BP 114/77    Pulse (!) 114   Constitutional:  Alert and oriented, No acute distress. HEENT: [place] AT, moist mucus membranes.  Trachea midline, no masses. Cardiovascular: No clubbing, cyanosis, or edema. Respiratory: Normal respiratory effort, no increased work of breathing. GI: Abdomen is soft, nontender,  nondistended, no abdominal masses GU: No CVA tenderness.  Lymph: No cervical or inguinal lymphadenopathy. Skin: No rashes, bruises or suspicious lesions. Neurologic: Grossly intact, no focal deficits, moving all 4 extremities. Psychiatric: Normal mood and affect.  Laboratory Data: Lab Results  Component Value Date   WBC 14.1 (H) 02/27/2016   HGB 15.3 07/26/2016   HCT 44.1 02/27/2016   MCV 88 02/27/2016   PLT 202 02/27/2016    Lab Results  Component Value Date   CREATININE 0.76 04/29/2016    No results found for: PSA  No results found for: TESTOSTERONE  No results found for: HGBA1C  Urinalysis    Component Value Date/Time   COLORURINE YELLOW 02/28/2012 2250   APPEARANCEUR Clear 10/23/2021 1312   LABSPEC <1.005 (L) 02/28/2012 2250   PHURINE 6.0 02/28/2012 2250   GLUCOSEU Negative 10/23/2021 1312   HGBUR NEGATIVE 02/28/2012 2250   BILIRUBINUR Negative 10/23/2021 1312   KETONESUR NEGATIVE 02/28/2012 2250   PROTEINUR Negative 10/23/2021 1312   PROTEINUR NEGATIVE 02/28/2012 2250   UROBILINOGEN negative 12/19/2015 1159   UROBILINOGEN 0.2 02/28/2012 2250   NITRITE Negative 10/23/2021 1312   NITRITE NEGATIVE 02/28/2012 2250   LEUKOCYTESUR Negative 10/23/2021 1312    Lab Results  Component Value Date   LABMICR See below: 10/23/2021   WBCUA None seen 10/23/2021   LABEPIT None seen 10/23/2021   MUCUS neg 12/19/2015   BACTERIA None seen 10/23/2021    Pertinent Imaging:  Results for orders placed in visit on 01/17/14  DG Abd 1 View  Narrative CLINICAL DATA:  Abdominal pain  EXAM: ABDOMEN - 1 VIEW  COMPARISON:  CT, 02/1912  FINDINGS: The bowel gas pattern is normal. No radio-opaque calculi or other significant radiographic abnormality are seen.  IMPRESSION: Negative.   Electronically Signed By: Lajean Manes M.D. On: 01/17/2014 13:39  No results found for this or any previous visit.  No results found for this or any previous visit.  No results  found for this or any previous visit.  No results found for this or any previous visit.  No results found for this or any previous visit.  No results found for this or any previous visit.  No results found for this or any previous visit.   Assessment & Plan:    1. Pain in left testicle -pain medication refilled  2. Chronic prostatitis without hematuria -continue flomax 0.4mg  daily  3. Urinary hesitancy -Continue flomax 0.4mg  daily   No follow-ups on file.  Nicolette Bang, MD  [facility] [place]

## 2022-02-01 ENCOUNTER — Other Ambulatory Visit: Payer: Self-pay | Admitting: Urology

## 2022-02-02 ENCOUNTER — Telehealth: Payer: Self-pay

## 2022-02-02 NOTE — Telephone Encounter (Signed)
Patient requesting refill on pain medication.

## 2022-02-11 ENCOUNTER — Other Ambulatory Visit: Payer: Self-pay | Admitting: Urology

## 2022-02-12 ENCOUNTER — Telehealth: Payer: Self-pay

## 2022-02-12 NOTE — Telephone Encounter (Signed)
Patient called office for pain medication refill ? ?Discussed with Dr. Ronne Binning per MD< patient is to follow up with his pain management doctor regarding any future pain medication that is needed. ? ?Patient informed.  ?

## 2022-04-21 ENCOUNTER — Ambulatory Visit: Payer: Medicare HMO | Admitting: Urology

## 2022-04-21 DIAGNOSIS — N411 Chronic prostatitis: Secondary | ICD-10-CM
